# Patient Record
Sex: Female | Born: 1985 | Race: Black or African American | Hispanic: No | Marital: Married | Smoking: Former smoker
Health system: Southern US, Community
[De-identification: ages and names within clinical notes are randomized; demographics above are authoritative.]

## PROBLEM LIST (undated history)

## (undated) DIAGNOSIS — R51 Headache: Secondary | ICD-10-CM

## (undated) DIAGNOSIS — R519 Headache, unspecified: Secondary | ICD-10-CM

## (undated) DIAGNOSIS — Z8759 Personal history of other complications of pregnancy, childbirth and the puerperium: Secondary | ICD-10-CM

## (undated) HISTORY — DX: Personal history of other complications of pregnancy, childbirth and the puerperium: Z87.59

## (undated) HISTORY — DX: Headache, unspecified: R51.9

## (undated) HISTORY — DX: Headache: R51

---

## 2004-04-30 ENCOUNTER — Ambulatory Visit: Payer: Self-pay | Admitting: Obstetrics and Gynecology

## 2004-06-06 ENCOUNTER — Ambulatory Visit: Payer: Self-pay | Admitting: Obstetrics and Gynecology

## 2004-06-20 ENCOUNTER — Ambulatory Visit: Payer: Self-pay | Admitting: Obstetrics and Gynecology

## 2004-09-29 ENCOUNTER — Emergency Department (HOSPITAL_COMMUNITY): Admission: EM | Admit: 2004-09-29 | Discharge: 2004-09-29 | Payer: Self-pay | Admitting: Emergency Medicine

## 2005-09-12 ENCOUNTER — Emergency Department (HOSPITAL_COMMUNITY): Admission: EM | Admit: 2005-09-12 | Discharge: 2005-09-12 | Payer: Self-pay | Admitting: Emergency Medicine

## 2006-12-28 ENCOUNTER — Encounter: Admission: RE | Admit: 2006-12-28 | Discharge: 2006-12-28 | Payer: Self-pay | Admitting: Obstetrics & Gynecology

## 2007-05-18 ENCOUNTER — Emergency Department (HOSPITAL_COMMUNITY): Admission: EM | Admit: 2007-05-18 | Discharge: 2007-05-18 | Payer: Self-pay | Admitting: Emergency Medicine
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
## 2010-05-31 NOTE — Group Therapy Note (Signed)
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  She was brought in today to discuss treatment.  She is a  smoker.  I have counseled her on how important it was to stop smoking and  that if she needed help we could help her with that.   PHYSICAL EXAMINATION:  GENITOURINARY:  On examination, she has a small  cervix, very slight ectropion which I think is amenable to a LEEP treatment  here in the clinic.   MEDICAL DECISION MAKING:  She will be watching the film today, and we will  schedule her for a LEEP conization in the near future.   IMPRESSION:  Cervical intraepithelial neoplasia, grade 2-3.      PR/MEDQ  D:  04/30/2004  T:  04/30/2004  Job:  [id number]

## 2010-05-31 NOTE — Group Therapy Note (Signed)
NAMELONITA, DEBES NO.:  [id number]   MEDICAL RECORD NO.:  [id number]          PATIENT TYPE:  WOC   LOCATION:  [facility]                   FACILITY:  [facility]   PHYSICIAN:  Argentina Donovan, MD        DATE OF BIRTH:  11-30-1985   DATE OF SERVICE:  06/06/2004                                    CLINIC NOTE   PROCEDURE:  LEEP conization of cervix.   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   POSTOPERATIVE DIAGNOSIS:  Pending pathology report.   ANESTHESIA:  Local.   SURGEON:  Argentina Donovan, M.D.   SPECIMEN SENT TO PATHOLOGY:  LEEP conization.   DESCRIPTION OF PROCEDURE:  With the patient in the dorsal lithotomy  position, Graves insulated speculum placed in the vagina so the cervix was  in the center of the viewing area, and a lateral speculum placed alongside.  Using 1 mL of 1% Xylocaine with 1:100,000 epinephrine injected in each of  four quadrants at 2, 4, 8, and 10 o'clock around the cervical os and  transition zone, a 1.5 LEEP loop was used to take the biopsy specimen. The  surrounding areas were then coagulated with a 50 watt coagulating current.  The original biopsy was taken with a 50 blended one. There was minimal to no  bleeding. The patient tolerated the procedure well and was discharged to  home.   PLAN:  Follow-up Pap smear in 4 months.      PR/MEDQ  D:  06/06/2004  T:  06/06/2004  Job:  [id number]

## 2014-03-13 ENCOUNTER — Emergency Department (HOSPITAL_COMMUNITY)
Admission: EM | Admit: 2014-03-13 | Discharge: 2014-03-13 | Disposition: A | Discharge status: Home / Self Care | Payer: Self-pay | Attending: Emergency Medicine | Admitting: Emergency Medicine

## 2014-03-13 ENCOUNTER — Emergency Department (HOSPITAL_COMMUNITY): Payer: Self-pay

## 2014-03-13 ENCOUNTER — Encounter (HOSPITAL_COMMUNITY): Payer: Self-pay | Admitting: *Deleted

## 2014-03-13 DIAGNOSIS — S46911A Strain of unspecified muscle, fascia and tendon at shoulder and upper arm level, right arm, initial encounter: Secondary | ICD-10-CM | POA: Insufficient documentation

## 2014-03-13 DIAGNOSIS — Y998 Other external cause status: Secondary | ICD-10-CM | POA: Insufficient documentation

## 2014-03-13 DIAGNOSIS — Y9389 Activity, other specified: Secondary | ICD-10-CM | POA: Insufficient documentation

## 2014-03-13 DIAGNOSIS — S46811A Strain of other muscles, fascia and tendons at shoulder and upper arm level, right arm, initial encounter: Secondary | ICD-10-CM

## 2014-03-13 DIAGNOSIS — G5601 Carpal tunnel syndrome, right upper limb: Secondary | ICD-10-CM

## 2014-03-13 DIAGNOSIS — X58XXXA Exposure to other specified factors, initial encounter: Secondary | ICD-10-CM | POA: Insufficient documentation

## 2014-03-13 DIAGNOSIS — Y9289 Other specified places as the place of occurrence of the external cause: Secondary | ICD-10-CM | POA: Insufficient documentation

## 2014-03-13 MED ORDER — HYDROCODONE-ACETAMINOPHEN 5-325 MG PO TABS
1.0000 | ORAL_TABLET | ORAL | Status: DC | PRN
Start: 2014-03-13 — End: 2014-07-11

## 2014-03-13 MED ORDER — DICLOFENAC SODIUM 75 MG PO TBEC: 75.0000 mg | DELAYED_RELEASE_TABLET | Freq: Two times a day (BID) | ORAL | Status: DC

## 2014-03-13 MED ORDER — KETOROLAC TROMETHAMINE 10 MG PO TABS
10.0000 mg | ORAL_TABLET | Freq: Once | ORAL | Status: AC
  Administered 2014-03-13: 10 mg via ORAL
  Filled 2014-03-13: qty 1

## 2014-03-13 NOTE — Discharge Instructions (Signed)
Please use your sling and splint daily. See Dr Romeo AppleHarrison or the orthopedic MD of your choice if symptoms not improving. Carpal Tunnel Syndrome The carpal tunnel is an area under the skin of the palm of your hand. Nerves, blood vessels, and strong tissues (tendons) pass through the tunnel. The tunnel can become puffy (swollen). If this happens, a nerve can be pinched in the wrist. This causes carpal tunnel syndrome.  HOME CARE  Take all medicine as told by your doctor.  If you were given a splint, wear it as told. Wear it at night or at times when your doctor told you to.  Rest your wrist from the activity that causes your pain.  Put ice on your wrist after long periods of wrist activity.  Put ice in a plastic bag.  Place a towel between your skin and the bag.  Leave the ice on for 15-20 minutes, 03-04 times a day.  Keep all doctor visits as told. GET HELP RIGHT AWAY IF:  You have new problems you cannot explain.  Your problems get worse and medicine does not help. MAKE SURE YOU:   Understand these instructions.  Will watch your condition.  Will get help right away if you are not doing well or get worse. Document Released: 12/19/2010 Document Revised: 03/24/2011 Document Reviewed: 12/19/2010 [facility] Patient Information 2015 [employer], [place]. This information is not intended to replace advice given to you by your health care provider. Make sure you discuss any questions you have with your health care provider.  Shoulder Sprain A shoulder sprain is the result of damage to the tough, fiber-like tissues (ligaments) that help hold your shoulder in place. The ligaments may be stretched or torn. Besides the main shoulder joint (the ball and socket), there are several smaller joints that connect the bones in this area. A sprain usually involves one of those joints. Most often it is the acromioclavicular (or AC) joint. That is the joint that connects the collarbone (clavicle) and the  shoulder blade (scapula) at the top point of the shoulder blade (acromion). A shoulder sprain is a mild form of what is called a shoulder separation. Recovering from a shoulder sprain may take some time. For some, pain lingers for several months. Most people recover without long term problems. CAUSES   A shoulder sprain is usually caused by some kind of trauma. This might be:  Falling on an outstretched arm.  Being hit hard on the shoulder.  Twisting the arm.  Shoulder sprains are more likely to occur in people who:  Play sports.  Have balance or coordination problems. SYMPTOMS   Pain when you move your shoulder.  Limited ability to move the shoulder.  Swelling and tenderness on top of the shoulder.  Redness or warmth in the shoulder.  Bruising.  A change in the shape of the shoulder. DIAGNOSIS  Your healthcare provider may:  Ask about your symptoms.  Ask about recent activity that might have caused those symptoms.  Examine your shoulder. You may be asked to do simple exercises to test movement. The other shoulder will be examined for comparison.  Order some tests that provide a look inside the body. They can show the extent of the injury. The tests could include:  X-rays.  CT (computed tomography) scan.  MRI (magnetic resonance imaging) scan. RISKS AND COMPLICATIONS  Loss of full shoulder motion.  Ongoing shoulder pain. TREATMENT  How long it takes to recover from a shoulder sprain depends on how severe it  was. Treatment options may include:  Rest. You should not use the arm or shoulder until it heals.  Ice. For 2 or 3 days after the injury, put an ice pack on the shoulder up to 4 times a day. It should stay on for 15 to 20 minutes each time. Wrap the ice in a towel so it does not touch your skin.  Over-the-counter medicine to relieve pain.  A sling or brace. This will keep the arm still while the shoulder is healing.  Physical therapy or rehabilitation  exercises. These will help you regain strength and motion. Ask your healthcare provider when it is OK to begin these exercises.  Surgery. The need for surgery is rare with a sprained shoulder, but some people may need surgery to keep the joint in place and reduce pain. HOME CARE INSTRUCTIONS   Ask your healthcare provider about what you should and should not do while your shoulder heals.  Make sure you know how to apply ice to the correct area of your shoulder.  Talk with your healthcare provider about which medications should be used for pain and swelling.  If rehabilitation therapy will be needed, ask your healthcare provider to refer you to a therapist. If it is not recommended, then ask about at-home exercises. Find out when exercise should begin. SEEK MEDICAL CARE IF:  Your pain, swelling, or redness at the joint increases. SEEK IMMEDIATE MEDICAL CARE IF:   You have a fever.  You cannot move your arm or shoulder. Document Released: 05/18/2008 Document Revised: 03/24/2011 Document Reviewed: 05/18/2008 [facility] Patient Information 2015 [place], [place]. This information is not intended to replace advice given to you by your health care provider. Make sure you discuss any questions you have with your health care provider.

## 2014-03-13 NOTE — ED Notes (Signed)
Pain rt arm /shoulder, with numb sensation rt forearm at times.  Certain movements of neck make it worse.

## 2014-03-13 NOTE — ED Provider Notes (Signed)
CSN: [id number]     Arrival date & time 03/13/14  1100 History  This chart was scribed for non-physician practitioner Ivery Quale, PA-C, working with Benny Lennert, MD by Littie Deeds, ED Scribe. This patient was seen in room APFT20/APFT20 and the patient's care was started at 1:45 PM.      Chief Complaint  Patient presents with  . Arm Pain   The history is provided by the patient. No language interpreter was used.   HPI Comments: Veronica Whitaker is a 29 y.o. female who presents to the Emergency Department complaining of gradual onset, worsening right shoulder pain radiating down her arm that started about 2 weeks ago. Patient initially thought it was a spasm, but it has been getting worse every day. Her pain is worsened with certain movements. She also reports having associated neck pain worsened with certain movements as well as right arm numbness that started today. Patient denies injury.  Patient also reports having intermittent, sharp, burning right wrist pain with swelling that radiates up to her arm. This is also worsened with certain movements.  History reviewed. No pertinent past medical history. Past Surgical History  Procedure Laterality Date  . Cesarean section     History reviewed. No pertinent family history. History  Substance Use Topics  . Smoking status: Never Smoker   . Smokeless tobacco: Not on file  . Alcohol Use: Yes   OB History    No data available     Review of Systems  Musculoskeletal: Positive for arthralgias and neck pain.  Neurological: Positive for numbness.  All other systems reviewed and are negative.     Allergies  Review of patient's allergies indicates no known allergies.  Home Medications   Prior to Admission medications   Not on File   BP 102/56 mmHg  Pulse 81  Temp(Src) 98.3 F (36.8 C) (Oral)  Resp 18  Ht  (1.499 m)  Wt 125 lb (56.7 kg)  BMI 25.23 kg/m2  SpO2 100%  LMP 01/29/2014 Physical Exam  Constitutional: She is  oriented to person, place, and time. She appears well-developed and well-nourished. No distress.  HENT:  Head: Normocephalic and atraumatic.  Mouth/Throat: Oropharynx is clear and moist. No oropharyngeal exudate.  Eyes: Pupils are equal, round, and reactive to light.  Neck: Neck supple.  Cardiovascular: Normal rate, regular rhythm and normal heart sounds.   No murmur heard. Pulmonary/Chest: Effort normal and breath sounds normal. No respiratory distress. She has no wheezes. She has no rales.  Musculoskeletal: She exhibits tenderness. She exhibits no edema.  Pain at upper trapezius on the right. Pain at the [facility] joint on the right. Pain along scapula on the right. No deformity of the clavicle. Pain on the right with shoulder shrug. Positive Tinel's and pain with wrist flexion.  Neurological: She is alert and oriented to person, place, and time. No cranial nerve deficit.  Skin: Skin is warm and dry. No rash noted.  Psychiatric: She has a normal mood and affect. Her behavior is normal.  Nursing note and vitals reviewed.   ED Course  Procedures  DIAGNOSTIC STUDIES: Oxygen Saturation is 100% on room air, normal by my interpretation.    COORDINATION OF CARE: 1:54 PM-Discussed treatment plan which includes medication, sling and wrist splint with pt at bedside and pt agreed to plan.    Labs Review Labs Reviewed - No data to display  Imaging Review Dg Shoulder Right  03/13/2014   CLINICAL DATA:  Acute right shoulder pain  for 2 weeks after stretching injury. Initial encounter.  EXAM: RIGHT SHOULDER - 2+ VIEW  COMPARISON:  None.  FINDINGS: There is no evidence of fracture or dislocation. There is no evidence of arthropathy or other focal bone abnormality. Soft tissues are unremarkable.  IMPRESSION: Normal right shoulder.   Electronically Signed   By: Lupita RaiderJames  Green Jr, M.D.   On: 03/13/2014 12:41     EKG Interpretation None      MDM  Examination favors trapezius strain, and carpal tunnel  syndrome on the right. Pt would not cooperate for rotator cuff testing.  Pt placed in wrist splint and sling. Rx for diclofenac and norco given to the patient. Pt referred to orthopedics.   Final diagnoses:  Carpal tunnel syndrome of right wrist  Trapezius strain, right, initial encounter    *I have reviewed nursing notes, vital signs, and all appropriate lab and imaging results for this patient.**  **I personally performed the services described in this documentation, which was scribed in my presence. The recorded information has been reviewed and is accurate.Kathie Dike*   Jaquil Todt M Elizar Alpern, PA-C 03/14/14 2041  Benny LennertJoseph L Zammit, MD 03/16/14 [phone]

## 2014-07-11 ENCOUNTER — Encounter: Payer: Self-pay | Admitting: *Deleted

## 2014-07-13 ENCOUNTER — Telehealth: Payer: Self-pay | Admitting: *Deleted

## 2014-07-13 ENCOUNTER — Other Ambulatory Visit: Payer: Self-pay | Admitting: Obstetrics & Gynecology

## 2014-07-13 DIAGNOSIS — O3680X1 Pregnancy with inconclusive fetal viability, fetus 1: Secondary | ICD-10-CM

## 2014-07-13 NOTE — Telephone Encounter (Signed)
Pt c/o brownish spotting, no cramping, no vaginal bleeding with early pregnancy. Pt informed to keep her appt for tomorrow for her initial u/s. If cramping or vaginal bleeding occures pt to go to [facility]. Pt verbalized understanding.

## 2014-07-14 ENCOUNTER — Other Ambulatory Visit: Payer: Medicaid Other

## 2014-07-14 ENCOUNTER — Other Ambulatory Visit: Payer: Self-pay | Admitting: Obstetrics & Gynecology

## 2014-07-14 ENCOUNTER — Ambulatory Visit (INDEPENDENT_AMBULATORY_CARE_PROVIDER_SITE_OTHER): Payer: Medicaid Other

## 2014-07-14 DIAGNOSIS — O4691 Antepartum hemorrhage, unspecified, first trimester: Secondary | ICD-10-CM | POA: Diagnosis not present

## 2014-07-14 DIAGNOSIS — O3680X1 Pregnancy with inconclusive fetal viability, fetus 1: Secondary | ICD-10-CM

## 2014-07-14 DIAGNOSIS — O209 Hemorrhage in early pregnancy, unspecified: Secondary | ICD-10-CM

## 2014-07-14 DIAGNOSIS — O200 Threatened abortion: Secondary | ICD-10-CM

## 2014-07-14 NOTE — Progress Notes (Addendum)
US normal anteverted uterus w/ NO IUP seen, unknown LMP,normal ov's bilat, EEC 1CM,Pt will have blood work done today and on Tuesday per Dr. Emelda FearFerguson.

## 2014-07-15 LAB — BETA HCG QUANT (REF LAB): hCG Quant: 1355 m[IU]/mL

## 2014-07-19 ENCOUNTER — Telehealth: Payer: Self-pay | Admitting: Obstetrics & Gynecology

## 2014-07-19 DIAGNOSIS — O200 Threatened abortion: Secondary | ICD-10-CM

## 2014-07-19 LAB — BETA HCG QUANT (REF LAB): hCG Quant: 112 m[IU]/mL

## 2014-07-19 NOTE — Telephone Encounter (Signed)
Pt informed QHCG 113, did decrease from last QHCG.

## 2014-07-19 NOTE — Telephone Encounter (Signed)
Pt was bleeding at time of u/s here, and qhcg was 1355, and has declined to 112, pt assymptomatic. This is consistent with a spontaneous early pregnancy loss. Please confirm blood type in chart, if rh negative, needs Rhophylac Repeat urine preg test in 1 wk, should be negative

## 2014-07-20 NOTE — Telephone Encounter (Signed)
Pt informed per Dr. Emelda FearFerguson, [facility], also ABO/Rh ordered for verify pt blood type. If negative will need Rhogam. Pt aware ordered placed, to go to [facility] tomorrow for ABO/Rh. Pt to call our office back to find out results.

## 2014-07-21 ENCOUNTER — Other Ambulatory Visit: Payer: Self-pay | Admitting: Obstetrics and Gynecology

## 2014-07-21 LAB — OB RESULTS CONSOLE ABO/RH: RH TYPE: POSITIVE

## 2014-07-22 LAB — ABO/RH: RH TYPE: POSITIVE

## 2014-07-22 LAB — HCG, BETA SUBUNIT, QN (SERIAL): HCG, Beta Chain, Quant, S: 31 m[IU]/mL

## 2014-07-24 ENCOUNTER — Telehealth: Payer: Self-pay | Admitting: *Deleted

## 2014-07-24 NOTE — Telephone Encounter (Signed)
Spoke with the pt and she is aware of her blood type and QHCG results. Pt wanted to know what to do next. I switched the call up front so the pt could make an appointment to discuss things with a provider. Pt verbalized understanding, appointment was given.

## 2014-07-26 ENCOUNTER — Ambulatory Visit (INDEPENDENT_AMBULATORY_CARE_PROVIDER_SITE_OTHER): Payer: Medicaid Other | Admitting: Women's Health

## 2014-07-26 ENCOUNTER — Encounter: Payer: Self-pay | Admitting: Women's Health

## 2014-07-26 VITALS — BP 90/60 | HR 64 | Ht 59.0 in | Wt 133.0 lb

## 2014-07-26 DIAGNOSIS — Z3009 Encounter for other general counseling and advice on contraception: Secondary | ICD-10-CM

## 2014-07-26 DIAGNOSIS — O039 Complete or unspecified spontaneous abortion without complication: Secondary | ICD-10-CM

## 2014-07-26 MED ORDER — NORETHIN-ETH ESTRAD-FE BIPHAS 1 MG-10 MCG / 10 MCG PO TABS: 1.0000 | ORAL_TABLET | Freq: Every day | ORAL | Status: DC

## 2014-07-26 NOTE — Patient Instructions (Signed)
Miscarriage A miscarriage is the sudden loss of an unborn baby (fetus) before the 20th week of pregnancy. Most miscarriages happen in the first 3 months of pregnancy. Sometimes, it happens before a woman even knows she is pregnant. A miscarriage is also called a "spontaneous miscarriage" or "early pregnancy loss." Having a miscarriage can be an emotional experience. Talk with your caregiver about any questions you may have about miscarrying, the grieving process, and your future pregnancy plans. CAUSES   Problems with the fetal chromosomes that make it impossible for the baby to develop normally. Problems with the baby's genes or chromosomes are most often the result of errors that occur, by chance, as the embryo divides and grows. The problems are not inherited from the parents.  Infection of the cervix or uterus.   Hormone problems.   Problems with the cervix, such as having an incompetent cervix. This is when the tissue in the cervix is not strong enough to hold the pregnancy.   Problems with the uterus, such as an abnormally shaped uterus, uterine fibroids, or congenital abnormalities.   Certain medical conditions.   Smoking, drinking alcohol, or taking illegal drugs.   Trauma.  Often, the cause of a miscarriage is unknown.  SYMPTOMS   Vaginal bleeding or spotting, with or without cramps or pain.  Pain or cramping in the abdomen or lower back.  Passing fluid, tissue, or blood clots from the vagina. DIAGNOSIS  Your caregiver will perform a physical exam. You may also have an ultrasound to confirm the miscarriage. Blood or urine tests may also be ordered. TREATMENT   Sometimes, treatment is not necessary if you naturally pass all the fetal tissue that was in the uterus. If some of the fetus or placenta remains in the body (incomplete miscarriage), tissue left behind may become infected and must be removed. Usually, a dilation and curettage (D and C) procedure is performed.  During a D and C procedure, the cervix is widened (dilated) and any remaining fetal or placental tissue is gently removed from the uterus.  Antibiotic medicines are prescribed if there is an infection. Other medicines may be given to reduce the size of the uterus (contract) if there is a lot of bleeding.  If you have Rh negative blood and your baby was Rh positive, you will need a Rh immunoglobulin shot. This shot will protect any future baby from having Rh blood problems in future pregnancies. HOME CARE INSTRUCTIONS   Your caregiver may order bed rest or may allow you to continue light activity. Resume activity as directed by your caregiver.  Have someone help with home and family responsibilities during this time.   Keep track of the number of sanitary pads you use each day and how soaked (saturated) they are. Write down this information.   Do not use tampons. Do not douche or have sexual intercourse until approved by your caregiver.   Only take over-the-counter or prescription medicines for pain or discomfort as directed by your caregiver.   Do not take aspirin. Aspirin can cause bleeding.   Keep all follow-up appointments with your caregiver.   If you or your partner have problems with grieving, talk to your caregiver or seek counseling to help cope with the pregnancy loss. Allow enough time to grieve before trying to get pregnant again.  SEEK IMMEDIATE MEDICAL CARE IF:   You have severe cramps or pain in your back or abdomen.  You have a fever.  You pass large blood clots (walnut-sized   or larger) ortissue from your vagina. Save any tissue for your caregiver to inspect.   Your bleeding increases.   You have a thick, bad-smelling vaginal discharge.  You become lightheaded, weak, or you faint.   You have chills.  MAKE SURE YOU:  Understand these instructions.  Will watch your condition.  Will get help right away if you are not doing well or get  worse. Document Released: 06/25/2000 Document Revised: 04/26/2012 Document Reviewed: 02/18/2011 [facility] Patient Information 2015 [place], [place]. This information is not intended to replace advice given to you by your health care provider. Make sure you discuss any questions you have with your health care provider.  Oral Contraception Use Oral contraceptive pills (OCPs) are medicines taken to prevent pregnancy. OCPs work by preventing the ovaries from releasing eggs. The hormones in OCPs also cause the cervical mucus to thicken, preventing the sperm from entering the uterus. The hormones also cause the uterine lining to become thin, not allowing a fertilized egg to attach to the inside of the uterus. OCPs are highly effective when taken exactly as prescribed. However, OCPs do not prevent sexually transmitted diseases (STDs). Safe sex practices, such as using condoms along with an OCP, can help prevent STDs. Before taking OCPs, you may have a physical exam and Pap test. Your health care provider may also order blood tests if necessary. Your health care provider will make sure you are a good candidate for oral contraception. Discuss with your health care provider the possible side effects of the OCP you may be prescribed. When starting an OCP, it can take 2 to 3 months for the body to adjust to the changes in hormone levels in your body.  HOW TO TAKE ORAL CONTRACEPTIVE PILLS Your health care provider may advise you on how to start taking the first cycle of OCPs. Otherwise, you can:   Start on day 1 of your menstrual period. You will not need any backup contraceptive protection with this start time.   Start on the first Sunday after your menstrual period or the day you get your prescription. In these cases, you will need to use backup contraceptive protection for the first week.   Start the pill at any time of your cycle. If you take the pill within 5 days of the start of your period, you are protected  against pregnancy right away. In this case, you will not need a backup form of birth control. If you start at any other time of your menstrual cycle, you will need to use another form of birth control for 7 days. If your OCP is the type called a minipill, it will protect you from pregnancy after taking it for 2 days (48 hours). After you have started taking OCPs:   If you forget to take 1 pill, take it as soon as you remember. Take the next pill at the regular time.   If you miss 2 or more pills, call your health care provider because different pills have different instructions for missed doses. Use backup birth control until your next menstrual period starts.   If you use a 28-day pack that contains inactive pills and you miss 1 of the last 7 pills (pills with no hormones), it will not matter. Throw away the rest of the non-hormone pills and start a new pill pack.  No matter which day you start the OCP, you will always start a new pack on that same day of the week. Have an extra pack of  OCPs and a backup contraceptive method available in case you miss some pills or lose your OCP pack.  HOME CARE INSTRUCTIONS   Do not smoke.   Always use a condom to protect against STDs. OCPs do not protect against STDs.   Use a calendar to mark your menstrual period days.   Read the information and directions that came with your OCP. Talk to your health care provider if you have questions.  SEEK MEDICAL CARE IF:   You develop nausea and vomiting.   You have abnormal vaginal discharge or bleeding.   You develop a rash.   You miss your menstrual period.   You are losing your hair.   You need treatment for mood swings or depression.   You get dizzy when taking the OCP.   You develop acne from taking the OCP.   You become pregnant.  SEEK IMMEDIATE MEDICAL CARE IF:   You develop chest pain.   You develop shortness of breath.   You have an uncontrolled or severe headache.    You develop numbness or slurred speech.   You develop visual problems.   You develop pain, redness, and swelling in the legs.  Document Released: 12/19/2010 Document Revised: 05/16/2013 Document Reviewed: 06/20/2012 [facility] Patient Information 2015 [place], [place]. This information is not intended to replace advice given to you by your health care provider. Make sure you discuss any questions you have with your health care provider.  Ethinyl Estradiol; Norethindrone Acetate; Ferrous fumarate tablets (contraception) What is this medicine? ETHINYL ESTRADIOL; NORETHINDRONE ACETATE; FERROUS FUMARATE (ETH in il es tra DYE ole; nor eth IN drone AS e tate; FER [place] FUE ma rate) is an oral contraceptive. The products combine two types of female hormones, an estrogen and a progestin. They are used to prevent ovulation and pregnancy. Some products are also used to treat acne in females. This medicine may be used for other purposes; ask your health care provider or pharmacist if you have questions. COMMON BRAND NAME(S): Estrostep Fe, Gildess Fe 1.5/30, Gildess Fe 1/20, Junel Fe 1.5/30, Junel Fe 1/20, Larin Fe, Lo Loestrin Fe, Loestrin 24 Fe, Loestrin FE 1.5/30, Loestrin FE 1/20, Lomedia 24 Fe, Microgestin Fe 1.5/30, Microgestin Fe 1/20, Tarina Fe 1/20, Tilia Fe, Tri-Legest Fe What should I tell my health care provider before I take this medicine? They need to know if you have any of these conditions: -abnormal vaginal bleeding -blood vessel disease -breast, cervical, endometrial, ovarian, liver, or uterine cancer -diabetes -gallbladder disease -heart disease or recent heart attack -high blood pressure -high cholesterol -history of blood clots -kidney disease -liver disease -migraine headaches -smoke tobacco -stroke -systemic lupus erythematosus (SLE) -an unusual or allergic reaction to estrogens, progestins, other medicines, foods, dyes, or preservatives -pregnant or trying to get  pregnant -breast-feeding How should I use this medicine? Take this medicine by mouth. To reduce nausea, this medicine may be taken with food. Follow the directions on the prescription label. Take this medicine at the same time each day and in the order directed on the package. Do not take your medicine more often than directed. A patient package insert for the product will be given with each prescription and refill. Read this sheet carefully each time. The sheet may change frequently. Contact your pediatrician regarding the use of this medicine in children. Special care may be needed. This medicine has been used in female children who have started having menstrual periods. Overdosage: If you think you've taken too much of this medicine  contact a poison control center or emergency room at once. Overdosage: If you think you have taken too much of this medicine contact a poison control center or emergency room at once. NOTE: This medicine is only for you. Do not share this medicine with others. What if I miss a dose? If you miss a dose, refer to the patient information sheet you received with your medicine for direction. If you miss more than one pill, this medicine may not be as effective and you may need to use another form of birth control. What may interact with this medicine? -acetaminophen -antibiotics or medicines for infections, especially rifampin, rifabutin, rifapentine, and griseofulvin, and possibly penicillins or tetracyclines -aprepitant -ascorbic acid (vitamin C) -atorvastatin -barbiturate medicines, such as phenobarbital -bosentan -carbamazepine -caffeine -clofibrate -cyclosporine -dantrolene -doxercalciferol -felbamate -grapefruit juice -hydrocortisone -medicines for anxiety or sleeping problems, such as diazepam or temazepam -medicines for diabetes, including pioglitazone -mineral  oil -modafinil -mycophenolate -nefazodone -oxcarbazepine -phenytoin -prednisolone -ritonavir or other medicines for HIV infection or AIDS -rosuvastatin -selegiline -soy isoflavones supplements -St. John's wort -tamoxifen or raloxifene -theophylline -thyroid hormones -topiramate -warfarin This list may not describe all possible interactions. Give your health care provider a list of all the medicines, herbs, non-prescription drugs, or dietary supplements you use. Also tell them if you smoke, drink alcohol, or use illegal drugs. Some items may interact with your medicine. What should I watch for while using this medicine? Visit your doctor or health care professional for regular checks on your progress. You will need a regular breast and pelvic exam and Pap smear while on this medicine. Use an additional method of contraception during the first cycle that you take these tablets. If you have any reason to think you are pregnant, stop taking this medicine right away and contact your doctor or health care professional. If you are taking this medicine for hormone related problems, it may take several cycles of use to see improvement in your condition. Smoking increases the risk of getting a blood clot or having a stroke while you are taking birth control pills, especially if you are more than 31 years old. You are strongly advised not to smoke. This medicine can make your body retain fluid, making your fingers, hands, or ankles swell. Your blood pressure can go up. Contact your doctor or health care professional if you feel you are retaining fluid. This medicine can make you more sensitive to the sun. Keep out of the sun. If you cannot avoid being in the sun, wear protective clothing and use sunscreen. Do not use sun lamps or tanning beds/booths. If you wear contact lenses and notice visual changes, or if the lenses begin to feel uncomfortable, consult your eye care specialist. In some women,  tenderness, swelling, or minor bleeding of the gums may occur. Notify your dentist if this happens. Brushing and flossing your teeth regularly may help limit this. See your dentist regularly and inform your dentist of the medicines you are taking. If you are going to have elective surgery, you may need to stop taking this medicine before the surgery. Consult your health care professional for advice. This medicine does not protect you against HIV infection (AIDS) or any other sexually transmitted diseases. What side effects may I notice from receiving this medicine? Side effects that you should report to your doctor or health care professional as soon as possible: -allergic reactions like skin rash, itching or hives, swelling of the face, lips, or tongue -breast tissue changes or discharge -changes  in vaginal bleeding during your period or between your periods -changes in vision -chest pain -confusion -coughing up blood -dizziness -feeling faint or lightheaded -headaches or migraines -leg, arm or groin pain -loss of balance or coordination -severe or sudden headaches -stomach pain (severe) -sudden shortness of breath -sudden numbness or weakness of the face, arm or leg -symptoms of vaginal infection like itching, irritation or unusual discharge -tenderness in the upper abdomen -trouble speaking or understanding -vomiting -yellowing of the eyes or skin Side effects that usually do not require medical attention (Report these to your doctor or health care professional if they continue or are bothersome.): -breakthrough bleeding and spotting that continues beyond the 3 initial cycles of pills -breast tenderness -mood changes, anxiety, depression, frustration, anger, or emotional outbursts -increased sensitivity to sun or ultraviolet light -nausea -skin rash, acne, or brown spots on the skin -weight gain (slight) This list may not describe all possible side effects. Call your doctor for  medical advice about side effects. You may report side effects to FDA at 1-800-FDA-1088. Where should I keep my medicine? Keep out of the reach of children. Store at room temperature between 15 and 30 degrees C (59 and 86 degrees F). Throw away any unused medicine after the expiration date. NOTE: This sheet is a summary. It may not cover all possible information. If you have questions about this medicine, talk to your doctor, pharmacist, or health care provider.  2015, [employer]. (2012-05-05 15:05:22)

## 2014-07-26 NOTE — Progress Notes (Signed)
Patient ID: Veronica Whitaker, female   DOB: 09-Jul-1985, 29 y.o.   MRN: [id number]   [facility]  Patient name: Veronica Whitaker MRN [id number]  Date of birth: 09-Jul-1985  CC & HPI:  Veronica Whitaker is a 29 y.o. African American female presenting today for f/u after spontaneous SAB. Had u/s 7/1 that revealed no GS,YS, or fetal pole- she had been bleeding before u/s. Serial HCGs: 1355 (7/1), 112 (7/5), 31 (7/8). Rh +. Reports bleeding stopped last week. Has never had any pain. Wasn't trying to get pregnant but wasn't preventing. Does want to get on birth control, wants pills. Had heavy bleeding w/ pills in past, but has been a long time ago. Does not smoke, no h/o HTN, DVT/PE, CVA, MI, or migraines w/ aura. Up to date [facility]   Pertinent History Reviewed:  Medical & Surgical Hx:   History reviewed. No pertinent past medical history. Past Surgical History  Procedure Laterality Date  . Cesarean section     Medications: Reviewed & Updated - see associated section Social History: Reviewed -  reports that she has never smoked. She does not have any smokeless tobacco history on file.  Objective Findings:  Vitals: BP 90/60 mmHg  Pulse 64  Ht 4\' 11"  (1.499 m)  Wt 133 lb (60.328 kg)  BMI 26.85 kg/m2  Physical Examination: General appearance - alert, well appearing, and in no distress  No results found for this or any previous visit (from the past 24 hour(s)).   Assessment & Plan:  A:   S/P spontaneous Ab  Contraception counseling P:  Rx Lo Loestrin w/ 11RF   F/U 3mths for COC f/u   Marge DuncansBooker, Brode Sculley Randall CNM, [facility]NP-BC 07/26/2014 9:57 AM

## 2014-09-13 ENCOUNTER — Emergency Department (HOSPITAL_COMMUNITY)
Admission: EM | Admit: 2014-09-13 | Discharge: 2014-09-13 | Disposition: A | Discharge status: Home / Self Care | Payer: Medicaid Other | Attending: Emergency Medicine | Admitting: Emergency Medicine

## 2014-09-13 ENCOUNTER — Encounter (HOSPITAL_COMMUNITY): Payer: Self-pay | Admitting: *Deleted

## 2014-09-13 DIAGNOSIS — Z3490 Encounter for supervision of normal pregnancy, unspecified, unspecified trimester: Secondary | ICD-10-CM

## 2014-09-13 DIAGNOSIS — R112 Nausea with vomiting, unspecified: Secondary | ICD-10-CM | POA: Diagnosis not present

## 2014-09-13 DIAGNOSIS — Z331 Pregnant state, incidental: Secondary | ICD-10-CM | POA: Diagnosis not present

## 2014-09-13 DIAGNOSIS — Z79899 Other long term (current) drug therapy: Secondary | ICD-10-CM | POA: Insufficient documentation

## 2014-09-13 LAB — CBC WITH DIFFERENTIAL/PLATELET
Basophils Absolute: 0 10*3/uL (ref 0.0–0.1)
Basophils Relative: 0 % (ref 0–1)
EOS PCT: 0 % (ref 0–5)
Eosinophils Absolute: 0 10*3/uL (ref 0.0–0.7)
HEMATOCRIT: 36.4 % (ref 36.0–46.0)
Hemoglobin: 12.1 g/dL (ref 12.0–15.0)
LYMPHS ABS: 3.6 10*3/uL (ref 0.7–4.0)
LYMPHS PCT: 31 % (ref 12–46)
MCH: 27.8 pg (ref 26.0–34.0)
MCHC: 33.2 g/dL (ref 30.0–36.0)
MCV: 83.5 fL (ref 78.0–100.0)
MONO ABS: 1 10*3/uL (ref 0.1–1.0)
Monocytes Relative: 9 % (ref 3–12)
Neutro Abs: 7 10*3/uL (ref 1.7–7.7)
Neutrophils Relative %: 60 % (ref 43–77)
PLATELETS: 252 10*3/uL (ref 150–400)
RBC: 4.36 MIL/uL (ref 3.87–5.11)
RDW: 13.5 % (ref 11.5–15.5)
WBC: 11.7 10*3/uL — ABNORMAL HIGH (ref 4.0–10.5)

## 2014-09-13 LAB — COMPREHENSIVE METABOLIC PANEL
ALBUMIN: 4.5 g/dL (ref 3.5–5.0)
ALT: 17 U/L (ref 14–54)
AST: 16 U/L (ref 15–41)
Alkaline Phosphatase: 38 U/L (ref 38–126)
Anion gap: 7 (ref 5–15)
BUN: 8 mg/dL (ref 6–20)
CHLORIDE: 103 mmol/L (ref 101–111)
CO2: 22 mmol/L (ref 22–32)
CREATININE: 0.52 mg/dL (ref 0.44–1.00)
Calcium: 8.8 mg/dL — ABNORMAL LOW (ref 8.9–10.3)
GFR calc Af Amer: >60 mL/min (ref >60)
GFR calc non Af Amer: >60 mL/min (ref >60)
GLUCOSE: 86 mg/dL (ref 65–99)
Potassium: 3.3 mmol/L — ABNORMAL LOW (ref 3.5–5.1)
SODIUM: 132 mmol/L — AB (ref 135–145)
Total Bilirubin: 0.3 mg/dL (ref 0.3–1.2)
Total Protein: 7.8 g/dL (ref 6.5–8.1)

## 2014-09-13 LAB — LIPASE, BLOOD: Lipase: 23 U/L (ref 22–51)

## 2014-09-13 LAB — TROPONIN I: Troponin I: <0.03 ng/mL (ref <0.031)

## 2014-09-13 LAB — HCG, SERUM, QUALITATIVE: Preg, Serum: POSITIVE — AB

## 2014-09-13 MED ORDER — ONDANSETRON 8 MG PO TBDP: ORAL_TABLET | ORAL | Status: DC

## 2014-09-13 MED ORDER — SODIUM CHLORIDE 0.9 % IV BOLUS (SEPSIS)
1000.0000 mL | Freq: Once | INTRAVENOUS | Status: AC
  Administered 2014-09-13: 1000 mL via INTRAVENOUS

## 2014-09-13 MED ORDER — ONDANSETRON HCL 4 MG/2ML IJ SOLN
4.0000 mg | Freq: Once | INTRAMUSCULAR | Status: AC
  Administered 2014-09-13: 4 mg via INTRAVENOUS
  Filled 2014-09-13: qty 2

## 2014-09-13 MED ORDER — KETOROLAC TROMETHAMINE 30 MG/ML IJ SOLN
30.0000 mg | Freq: Once | INTRAMUSCULAR | Status: AC
  Administered 2014-09-13: 30 mg via INTRAVENOUS
  Filled 2014-09-13: qty 1

## 2014-09-13 NOTE — ED Notes (Signed)
Provided patient ginger ale per patient request.

## 2014-09-13 NOTE — ED Notes (Signed)
MD at bedside. 

## 2014-09-13 NOTE — Discharge Instructions (Signed)
Zofran as prescribed as needed for nausea.  Follow-up with your OB in the next week. Call to arrange this appointment.   Nausea and Vomiting Nausea is a sick feeling that often comes before throwing up (vomiting). Vomiting is a reflex where stomach contents come out of your mouth. Vomiting can cause severe loss of body fluids (dehydration). Children and elderly adults can become dehydrated quickly, especially if they also have diarrhea. Nausea and vomiting are symptoms of a condition or disease. It is important to find the cause of your symptoms. CAUSES   Direct irritation of the stomach lining. This irritation can result from increased acid production (gastroesophageal reflux disease), infection, food poisoning, taking certain medicines (such as nonsteroidal anti-inflammatory drugs), alcohol use, or tobacco use.  Signals from the brain.These signals could be caused by a headache, heat exposure, an inner ear disturbance, increased pressure in the brain from injury, infection, a tumor, or a concussion, pain, emotional stimulus, or metabolic problems.  An obstruction in the gastrointestinal tract (bowel obstruction).  Illnesses such as diabetes, hepatitis, gallbladder problems, appendicitis, kidney problems, cancer, sepsis, atypical symptoms of a heart attack, or eating disorders.  Medical treatments such as chemotherapy and radiation.  Receiving medicine that makes you sleep (general anesthetic) during surgery. DIAGNOSIS Your caregiver may ask for tests to be done if the problems do not improve after a few days. Tests may also be done if symptoms are severe or if the reason for the nausea and vomiting is not clear. Tests may include:  Urine tests.  Blood tests.  Stool tests.  Cultures (to look for evidence of infection).  X-rays or other imaging studies. Test results can help your caregiver make decisions about treatment or the need for additional tests. TREATMENT You need to stay  well hydrated. Drink frequently but in small amounts.You may wish to drink water, sports drinks, clear broth, or eat frozen ice pops or gelatin dessert to help stay hydrated.When you eat, eating slowly may help prevent nausea.There are also some antinausea medicines that may help prevent nausea. HOME CARE INSTRUCTIONS   Take all medicine as directed by your caregiver.  If you do not have an appetite, do not force yourself to eat. However, you must continue to drink fluids.  If you have an appetite, eat a normal diet unless your caregiver tells you differently.  Eat a variety of complex carbohydrates (rice, wheat, potatoes, bread), lean meats, yogurt, fruits, and vegetables.  Avoid high-fat foods because they are more difficult to digest.  Drink enough water and fluids to keep your urine clear or pale yellow.  If you are dehydrated, ask your caregiver for specific rehydration instructions. Signs of dehydration may include:  Severe thirst.  Dry lips and mouth.  Dizziness.  Dark urine.  Decreasing urine frequency and amount.  Confusion.  Rapid breathing or pulse. SEEK IMMEDIATE MEDICAL CARE IF:   You have blood or brown flecks (like coffee grounds) in your vomit.  You have black or bloody stools.  You have a severe headache or stiff neck.  You are confused.  You have severe abdominal pain.  You have chest pain or trouble breathing.  You do not urinate at least once every 8 hours.  You develop cold or clammy skin.  You continue to vomit for longer than 24 to 48 hours.  You have a fever. MAKE SURE YOU:   Understand these instructions.  Will watch your condition.  Will get help right away if you are not doing well  or get worse. Document Released: 12/30/2004 Document Revised: 03/24/2011 Document Reviewed: 05/29/2010 [facility] Patient Information 2015 [place], [place]. This information is not intended to replace advice given to you by your health care provider.  Make sure you discuss any questions you have with your health care provider.  First Trimester of Pregnancy The first trimester of pregnancy is from week 1 until the end of week 12 (months 1 through 3). A week after a sperm fertilizes an egg, the egg will implant on the wall of the uterus. This embryo will begin to develop into a baby. Genes from you and your partner are forming the baby. The female genes determine whether the baby is a boy or a girl. At 6-8 weeks, the eyes and face are formed, and the heartbeat can be seen on ultrasound. At the end of 12 weeks, all the baby's organs are formed.  Now that you are pregnant, you will want to do everything you can to have a healthy baby. Two of the most important things are to get good prenatal care and to follow your health care provider's instructions. Prenatal care is all the medical care you receive before the baby's birth. This care will help prevent, find, and treat any problems during the pregnancy and childbirth. BODY CHANGES Your body goes through many changes during pregnancy. The changes vary from woman to woman.   You may gain or lose a couple of pounds at first.  You may feel sick to your stomach (nauseous) and throw up (vomit). If the vomiting is uncontrollable, call your health care provider.  You may tire easily.  You may develop headaches that can be relieved by medicines approved by your health care provider.  You may urinate more often. Painful urination may mean you have a bladder infection.  You may develop heartburn as a result of your pregnancy.  You may develop constipation because certain hormones are causing the muscles that push waste through your intestines to slow down.  You may develop hemorrhoids or swollen, bulging veins (varicose veins).  Your breasts may begin to grow larger and become tender. Your nipples may stick out more, and the tissue that surrounds them (areola) may become darker.  Your gums may bleed and  may be sensitive to brushing and flossing.  Dark spots or blotches (chloasma, mask of pregnancy) may develop on your face. This will likely fade after the baby is born.  Your menstrual periods will stop.  You may have a loss of appetite.  You may develop cravings for certain kinds of food.  You may have changes in your emotions from day to day, such as being excited to be pregnant or being concerned that something may go wrong with the pregnancy and baby.  You may have more vivid and strange dreams.  You may have changes in your hair. These can include thickening of your hair, rapid growth, and changes in texture. Some women also have hair loss during or after pregnancy, or hair that feels dry or thin. Your hair will most likely return to normal after your baby is born. WHAT TO EXPECT AT YOUR PRENATAL VISITS During a routine prenatal visit:  You will be weighed to make sure you and the baby are growing normally.  Your blood pressure will be taken.  Your abdomen will be measured to track your baby's growth.  The fetal heartbeat will be listened to starting around week 10 or 12 of your pregnancy.  Test results from any previous visits  will be discussed. Your health care provider may ask you:  How you are feeling.  If you are feeling the baby move.  If you have had any abnormal symptoms, such as leaking fluid, bleeding, severe headaches, or abdominal cramping.  If you have any questions. Other tests that may be performed during your first trimester include:  Blood tests to find your blood type and to check for the presence of any previous infections. They will also be used to check for low iron levels (anemia) and Rh antibodies. Later in the pregnancy, blood tests for diabetes will be done along with other tests if problems develop.  Urine tests to check for infections, diabetes, or protein in the urine.  An ultrasound to confirm the proper growth and development of the  baby.  An amniocentesis to check for possible genetic problems.  Fetal screens for spina bifida and Down syndrome.  You may need other tests to make sure you and the baby are doing well. HOME CARE INSTRUCTIONS  Medicines  Follow your health care provider's instructions regarding medicine use. Specific medicines may be either safe or unsafe to take during pregnancy.  Take your prenatal vitamins as directed.  If you develop constipation, try taking a stool softener if your health care provider approves. Diet  Eat regular, well-balanced meals. Choose a variety of foods, such as meat or vegetable-based protein, fish, milk and low-fat dairy products, vegetables, fruits, and whole grain breads and cereals. Your health care provider will help you determine the amount of weight gain that is right for you.  Avoid raw meat and uncooked cheese. These carry germs that can cause birth defects in the baby.  Eating four or five small meals rather than three large meals a day may help relieve nausea and vomiting. If you start to feel nauseous, eating a few soda crackers can be helpful. Drinking liquids between meals instead of during meals also seems to help nausea and vomiting.  If you develop constipation, eat more high-fiber foods, such as fresh vegetables or fruit and whole grains. Drink enough fluids to keep your urine clear or pale yellow. Activity and Exercise  Exercise only as directed by your health care provider. Exercising will help you:  Control your weight.  Stay in shape.  Be prepared for labor and delivery.  Experiencing pain or cramping in the lower abdomen or low back is a good sign that you should stop exercising. Check with your health care provider before continuing normal exercises.  Try to avoid standing for long periods of time. Move your legs often if you must stand in one place for a long time.  Avoid heavy lifting.  Wear low-heeled shoes, and practice good  posture.  You may continue to have sex unless your health care provider directs you otherwise. Relief of Pain or Discomfort  Wear a good support bra for breast tenderness.   Take warm sitz baths to soothe any pain or discomfort caused by hemorrhoids. Use hemorrhoid cream if your health care provider approves.   Rest with your legs elevated if you have leg cramps or low back pain.  If you develop varicose veins in your legs, wear support hose. Elevate your feet for 15 minutes, 3-4 times a day. Limit salt in your diet. Prenatal Care  Schedule your prenatal visits by the twelfth week of pregnancy. They are usually scheduled monthly at first, then more often in the last 2 months before delivery.  Write down your questions. Take them to  your prenatal visits.  Keep all your prenatal visits as directed by your health care provider. Safety  Wear your seat belt at all times when driving.  Make a list of emergency phone numbers, including numbers for family, friends, the hospital, and police and fire departments. General Tips  Ask your health care provider for a referral to a local prenatal education class. Begin classes no later than at the beginning of month 6 of your pregnancy.  Ask for help if you have counseling or nutritional needs during pregnancy. Your health care provider can offer advice or refer you to specialists for help with various needs.  Do not use hot tubs, steam rooms, or saunas.  Do not douche or use tampons or scented sanitary pads.  Do not cross your legs for long periods of time.  Avoid cat litter boxes and soil used by cats. These carry germs that can cause birth defects in the baby and possibly loss of the fetus by miscarriage or stillbirth.  Avoid all smoking, herbs, alcohol, and medicines not prescribed by your health care provider. Chemicals in these affect the formation and growth of the baby.  Schedule a dentist appointment. At home, brush your teeth with  a soft toothbrush and be gentle when you floss. SEEK MEDICAL CARE IF:   You have dizziness.  You have mild pelvic cramps, pelvic pressure, or nagging pain in the abdominal area.  You have persistent nausea, vomiting, or diarrhea.  You have a bad smelling vaginal discharge.  You have pain with urination.  You notice increased swelling in your face, hands, legs, or ankles. SEEK IMMEDIATE MEDICAL CARE IF:   You have a fever.  You are leaking fluid from your vagina.  You have spotting or bleeding from your vagina.  You have severe abdominal cramping or pain.  You have rapid weight gain or loss.  You vomit blood or material that looks like coffee grounds.  You are exposed to [place] measles and have never had them.  You are exposed to fifth disease or chickenpox.  You develop a severe headache.  You have shortness of breath.  You have any kind of trauma, such as from a fall or a car accident. Document Released: 12/24/2000 Document Revised: 05/16/2013 Document Reviewed: 11/09/2012 [facility] Patient Information 2015 [place], [place]. This information is not intended to replace advice given to you by your health care provider. Make sure you discuss any questions you have with your health care provider.

## 2014-09-13 NOTE — ED Provider Notes (Signed)
CSN: [id number]     Arrival date & time 09/13/14  1416 History   First MD Initiated Contact with Patient 09/13/14 1424     Chief Complaint  Patient presents with  . Emesis     (Consider location/radiation/quality/duration/timing/severity/associated sxs/prior Treatment) HPI Comments: Patient is a 29 year old female with no significant past medical history. She presents for evaluation of vomiting for the past several days. She reports she is unable to keep anything down. She feels weak and dehydrated. She denies any fevers or chills. She denies any abdominal pain. She does report some tightness in her chest which is worse with vomiting. She denies ill contacts and having consumed any suspicious foods.  Patient is a 29 y.o. female presenting with vomiting. The history is provided by the patient.  Emesis Severity:  Moderate Duration:  3 days Timing:  Constant Progression:  Worsening Chronicity:  New Recent urination:  Normal Relieved by:  Nothing Worsened by:  Nothing tried Ineffective treatments:  None tried Associated symptoms: no abdominal pain, no diarrhea and no fever     History reviewed. No pertinent past medical history. Past Surgical History  Procedure Laterality Date  . Cesarean section     Family History  Problem Relation Age of Onset  . Hypertension Mother   . Stroke Sister    Social History  Substance Use Topics  . Smoking status: Never Smoker   . Smokeless tobacco: None  . Alcohol Use: Yes   OB History    Gravida Para Term Preterm AB TAB SAB Ectopic Multiple Living   Review of Systems  Gastrointestinal: Positive for vomiting. Negative for abdominal pain and diarrhea.  All other systems reviewed and are negative.     Allergies  Review of patient's allergies indicates no known allergies.  Home Medications   Prior to Admission medications   Medication Sig Start Date End Date Taking? Authorizing Provider  Norethindrone-Ethinyl  Estradiol-Fe Biphas (LO LOESTRIN FE) 1 MG-10 MCG / 10 MCG tablet Take 1 tablet by mouth daily. 07/26/14   Cheral Marker, CNM  Prenatal Vit-Fe Fumarate-FA (PRENATAL VITAMIN PO) Take by mouth daily.    Historical Provider, MD   BP 128/60 mmHg  Pulse 81  Temp(Src) 97.8 F (36.6 C) (Oral)  Resp 18  Ht  (1.575 m)  Wt 132 lb (59.875 kg)  BMI 24.14 kg/m2  SpO2 100%  LMP 02/12/2014 Physical Exam  Constitutional: She is oriented to person, place, and time. She appears well-developed and well-nourished. No distress.  HENT:  Head: Normocephalic and atraumatic.  Neck: Normal range of motion. Neck supple.  Cardiovascular: Normal rate and regular rhythm.  Exam reveals no gallop and no friction rub.   No murmur heard. Pulmonary/Chest: Effort normal and breath sounds normal. No respiratory distress. She has no wheezes.  Abdominal: Soft. Bowel sounds are normal. She exhibits no distension. There is no tenderness.  Musculoskeletal: Normal range of motion.  Neurological: She is alert and oriented to person, place, and time.  Skin: Skin is warm and dry. She is not diaphoretic.  Nursing note and vitals reviewed.   ED Course  Procedures (including critical care time) Labs Review Labs Reviewed  COMPREHENSIVE METABOLIC PANEL  LIPASE, BLOOD  CBC WITH DIFFERENTIAL/PLATELET  TROPONIN I  HCG, SERUM, QUALITATIVE    Imaging Review No results found. I have personally reviewed and evaluated these images and lab results as part of my medical decision-making.  EKG Interpretation   Date/Time:  Wednesday September 13 2014 14:57:29 EDT Ventricular Rate:  65 PR Interval:  136 QRS Duration: 78 QT Interval:  401 QTC Calculation: 417 R Axis:   79 Text Interpretation:  Sinus arrhythmia Low voltage, precordial leads  Baseline wander in lead(s) I II aVR Confirmed by Neosha Switalski  MD, Venesa Semidey ([id number])  on 09/13/2014 3:03:32 PM      MDM   Final diagnoses:  None    Patient presents with complaints of  vomiting for the past several days. She is experiencing no abdominal pain. Laboratory studies reveal no significant abnormality within the exception of a positive pregnancy test. Patient was unaware that she was pregnant but does report to having a miscarriage several weeks ago. As she is having no vaginal bleeding or abdominal pain I do not feel as though further workup is indicated. I will prescribe a small quantity of Zofran she can take if her symptoms become severe. She will follow-up with her OB in the next week.    Geoffery Lyons, MD 09/13/14 [phone]

## 2014-09-13 NOTE — ED Notes (Signed)
Pt states vomiting began Monday. Vomited ~4 times today. States she feels dehydrated. Pt states chest discomfort began yesterday and is worse with vomiting. Denies abdominal pain and diarrhea. Pt states states she can only keep crackers, cheese and grapes down. Unable to keep liquids down.

## 2014-09-19 ENCOUNTER — Ambulatory Visit (INDEPENDENT_AMBULATORY_CARE_PROVIDER_SITE_OTHER): Payer: Medicaid Other | Admitting: Obstetrics and Gynecology

## 2014-09-19 VITALS — BP 102/67 | HR 80 | Wt 128.1 lb

## 2014-09-19 DIAGNOSIS — Z1389 Encounter for screening for other disorder: Secondary | ICD-10-CM

## 2014-09-19 DIAGNOSIS — Z36 Encounter for antenatal screening of mother: Secondary | ICD-10-CM

## 2014-09-19 DIAGNOSIS — Z8759 Personal history of other complications of pregnancy, childbirth and the puerperium: Secondary | ICD-10-CM

## 2014-09-19 DIAGNOSIS — Z3687 Encounter for antenatal screening for uncertain dates: Secondary | ICD-10-CM

## 2014-09-19 DIAGNOSIS — Z113 Encounter for screening for infections with a predominantly sexual mode of transmission: Secondary | ICD-10-CM

## 2014-09-19 DIAGNOSIS — Z369 Encounter for antenatal screening, unspecified: Secondary | ICD-10-CM

## 2014-09-19 DIAGNOSIS — Z8742 Personal history of other diseases of the female genital tract: Secondary | ICD-10-CM

## 2014-09-19 DIAGNOSIS — Z331 Pregnant state, incidental: Secondary | ICD-10-CM

## 2014-09-19 LAB — OB RESULTS CONSOLE VARICELLA ZOSTER ANTIBODY, IGG: Varicella: NON-IMMUNE/NOT IMMUNE

## 2014-09-19 NOTE — Progress Notes (Signed)
  Nicoletta Dress for [employer] nurse interview visit. G-3. P-1011.  LMP unknown, last menses in January as far as pt can recall. Miscarriage in July 2016. No D&C. Had been on Depo-provera since she was 29yo and stopped med.in 2010, had son in 2012, then got IUD but this caused pain so it was removed in Oct 2016.  Pregnancy eduction material explained and given.  No cats in the home. NOB labs ordered HIV labs and Drug screen were explained optional and she could opt out of tests but did not decline. Drug screen ordered and sent to lab. PNV encouraged. NT to discuss with provider. Ultrasound ordered for viability and dating. Pt had miscarriage in July, with last BHCG done then was 31 at Dr. Rayna Sexton office.  Had positive bhcg with no quant at ER for excessive n/v, chest pain from n/v.   Pt. follow up with provider pending, depending on ultrasound results for NOB physical.  All questions answered.  ZIKA EXPOSURE SCREEN:  The patient has not traveled to a [place] Virus endemic area within the past 6 months, nor has she had unprotected sex with a partner who has travelled to a [place] endemic region within the past 6 months. The patient has been advised to notify us if these factors change any time during this current pregnancy, so adequate testing and monitoring can be initiated.

## 2014-09-19 NOTE — Patient Instructions (Signed)

## 2014-09-20 LAB — URINALYSIS, ROUTINE W REFLEX MICROSCOPIC
Bilirubin, UA: NEGATIVE
Glucose, UA: NEGATIVE
Leukocytes, UA: NEGATIVE
NITRITE UA: NEGATIVE
PH UA: 7.5 (ref 5.0–7.5)
RBC, UA: NEGATIVE
Specific Gravity, UA: 1.03 (ref 1.005–1.030)
Urobilinogen, Ur: 0.2 mg/dL (ref 0.2–1.0)

## 2014-09-20 LAB — GC/CHLAMYDIA PROBE AMP
CHLAMYDIA, DNA PROBE: NEGATIVE
Neisseria gonorrhoeae by PCR: NEGATIVE

## 2014-09-20 LAB — CBC WITH DIFFERENTIAL/PLATELET
BASOS: 0 %
Basophils Absolute: 0 10*3/uL (ref 0.0–0.2)
EOS (ABSOLUTE): 0 10*3/uL (ref 0.0–0.4)
Eos: 0 %
Hematocrit: 37.6 % (ref 34.0–46.6)
Hemoglobin: 12.2 g/dL (ref 11.1–15.9)
IMMATURE GRANS (ABS): 0 10*3/uL (ref 0.0–0.1)
IMMATURE GRANULOCYTES: 0 %
LYMPHS: 23 %
Lymphocytes Absolute: 2.4 10*3/uL (ref 0.7–3.1)
MCH: 26.6 pg (ref 26.6–33.0)
MCHC: 32.4 g/dL (ref 31.5–35.7)
MCV: 82 fL (ref 79–97)
Monocytes Absolute: 0.7 10*3/uL (ref 0.1–0.9)
Monocytes: 7 %
NEUTROS PCT: 70 %
Neutrophils Absolute: 7.1 10*3/uL — ABNORMAL HIGH (ref 1.4–7.0)
PLATELETS: 278 10*3/uL (ref 150–379)
RBC: 4.59 x10E6/uL (ref 3.77–5.28)
RDW: 13.5 % (ref 12.3–15.4)
WBC: 10.3 10*3/uL (ref 3.4–10.8)

## 2014-09-20 LAB — RUBELLA ANTIBODY, IGM: Rubella IgM: <20 AU/mL (ref 0.0–19.9)

## 2014-09-20 LAB — MICROSCOPIC EXAMINATION: Casts: NONE SEEN /lpf

## 2014-09-20 LAB — SICKLE CELL SCREEN: SICKLE CELL SCREEN: NEGATIVE

## 2014-09-20 LAB — HEPATITIS B SURFACE ANTIGEN: HEP B S AG: NEGATIVE

## 2014-09-20 LAB — RPR: RPR Ser Ql: NONREACTIVE

## 2014-09-20 LAB — URINE CULTURE

## 2014-09-20 LAB — VARICELLA ZOSTER ANTIBODY, IGM

## 2014-09-20 LAB — HIV ANTIBODY (ROUTINE TESTING W REFLEX): HIV SCREEN 4TH GENERATION: NONREACTIVE

## 2014-09-21 LAB — SPECIMEN STATUS REPORT

## 2014-09-21 LAB — BETA HCG QUANT (REF LAB): HCG QUANT: 127656 m[IU]/mL

## 2014-09-25 ENCOUNTER — Encounter: Payer: Self-pay | Admitting: Obstetrics and Gynecology

## 2014-09-25 ENCOUNTER — Other Ambulatory Visit: Payer: Self-pay | Admitting: Obstetrics and Gynecology

## 2014-09-25 DIAGNOSIS — F1290 Cannabis use, unspecified, uncomplicated: Secondary | ICD-10-CM

## 2014-09-26 ENCOUNTER — Ambulatory Visit: Payer: Medicaid Other

## 2014-09-26 DIAGNOSIS — Z36 Encounter for antenatal screening of mother: Secondary | ICD-10-CM | POA: Diagnosis not present

## 2014-09-26 DIAGNOSIS — Z3687 Encounter for antenatal screening for uncertain dates: Secondary | ICD-10-CM

## 2014-09-26 DIAGNOSIS — Z369 Encounter for antenatal screening, unspecified: Secondary | ICD-10-CM

## 2014-09-26 DIAGNOSIS — Z8759 Personal history of other complications of pregnancy, childbirth and the puerperium: Secondary | ICD-10-CM

## 2014-09-26 DIAGNOSIS — Z331 Pregnant state, incidental: Secondary | ICD-10-CM | POA: Diagnosis not present

## 2014-09-26 DIAGNOSIS — Z8742 Personal history of other diseases of the female genital tract: Secondary | ICD-10-CM | POA: Diagnosis not present

## 2014-10-06 ENCOUNTER — Telehealth: Payer: Self-pay | Admitting: Obstetrics and Gynecology

## 2014-10-06 NOTE — Telephone Encounter (Signed)
Pt states when she woke up this am her rt side was hurting- near her hip. Did fall asleep on couch last nite. NO other sx. Advised tylenol prn and heating pad. Pt aware if sx gets worse she will need to be seen prior to appt on 9/29.

## 2014-10-06 NOTE — Telephone Encounter (Signed)
Pt is [redacted] wks pregnant and has pain on lower right side, throbbing aching pain. Pt wants to know if this is ok?

## 2014-10-12 ENCOUNTER — Ambulatory Visit (INDEPENDENT_AMBULATORY_CARE_PROVIDER_SITE_OTHER): Payer: Medicaid Other | Admitting: Obstetrics and Gynecology

## 2014-10-12 ENCOUNTER — Encounter: Payer: Self-pay | Admitting: Obstetrics and Gynecology

## 2014-10-12 VITALS — BP 97/60 | HR 92 | Wt 130.9 lb

## 2014-10-12 DIAGNOSIS — Z3491 Encounter for supervision of normal pregnancy, unspecified, first trimester: Secondary | ICD-10-CM

## 2014-10-12 DIAGNOSIS — Z1379 Encounter for other screening for genetic and chromosomal anomalies: Secondary | ICD-10-CM | POA: Diagnosis not present

## 2014-10-12 DIAGNOSIS — O34219 Maternal care for unspecified type scar from previous cesarean delivery: Secondary | ICD-10-CM

## 2014-10-12 DIAGNOSIS — O3421 Maternal care for scar from previous cesarean delivery: Secondary | ICD-10-CM

## 2014-10-12 LAB — POCT URINALYSIS DIPSTICK
BILIRUBIN UA: NEGATIVE
Blood, UA: NEGATIVE
Glucose, UA: NEGATIVE
KETONES UA: NEGATIVE
LEUKOCYTES UA: NEGATIVE
Nitrite, UA: NEGATIVE
PROTEIN UA: NEGATIVE
Spec Grav, UA: 1.015
Urobilinogen, UA: 0.2
pH, UA: 7.5

## 2014-10-12 NOTE — Progress Notes (Signed)
Discuss nt screen. No complaints. Last pap 2/20160 wnl.

## 2014-10-12 NOTE — Patient Instructions (Signed)
Trial of Labor After Cesarean Delivery Information A trial of labor after cesarean delivery (TOLAC) is when a woman tries to give birth vaginally after a previous cesarean delivery. TOLAC may be a safe and appropriate option for you depending on your medical history and other risk factors. When TOLAC is successful and you are able to have a vaginal delivery, this is called a vaginal birth after cesarean delivery (VBAC).  CANDIDATES FOR TOLAC TOLAC is possible for some women who:  Have undergone one or two prior cesarean deliveries in which the incision of the uterus was horizontal (low transverse).  Are carrying twins and have had one prior low transverse incision during a cesarean delivery.  Do not have a vertical (classical) uterine scar.  Have not had a tear in the wall of their uterus (uterine rupture). TOLAC is also supported for women who meet appropriate criteria and:  Are under the age of 40 years.  Are tall and have a body mass index (BMI) of less than 30.  Have an unknown uterine scar.  Give birth in a facility equipped to handle an emergency cesarean delivery. This team should be able to handle possible complications such as a uterine rupture.  Have thorough counseling about the benefits and risks of TOLAC.  Have discussed future pregnancy plans with their health care provider.  Plan to have several more pregnancies. MOST SUCCESSFUL CANDIDATES FOR TOLAC:  Have had a successful vaginal delivery before or after their cesarean delivery.  Experience labor that begins naturally on or before the due date (40 weeks of gestation).  Do not have a very large (macrosomic) baby.   Had a prior cesarean delivery but are not currently experiencing factors that would prompt a cesarean delivery (such as a breech position).  Had only one prior cesarean delivery.  Had a prior cesarean delivery that was performed early in labor and not after full cervical dilation. TOLAC may be most  appropriate for women who meet the above guidelines and who plan to have more pregnancies. TOLAC is not recommended for home births. LEAST SUCCESSFUL CANDIDATES FOR TOLAC:  Have an induced labor with an unfavorable cervix. An unfavorable cervix is when the cervix is not dilating enough (among other factors).  Have never had a vaginal delivery.  Have had more than two cesarean deliveries.  Have a pregnancy at more than 40 weeks of gestation.  Are pregnant with a baby with a suspected weight greater than 4,000 grams (8 pounds) and who have no prior history of a vaginal delivery.  Have closely spaced pregnancies. SUGGESTED BENEFITS OF TOLAC  You may have a faster recovery time.  You may have a shorter stay in the hospital.  You may have less pain and fewer problems than with a cesarean delivery. Women who have a cesarean delivery have a higher chance of needing blood or getting a fever, an infection, or a blood clot in the legs. SUGGESTED RISKS OF TOLAC The highest risk of complications happens to women who attempt a TOLAC and fail. A failed TOLAC results in an unplanned cesarean delivery. Risks related to TOLAC or repeat cesarean deliveries include:   Blood loss.  Infection.  Blood clot.  Injury to surrounding tissues or organs.  Having to remove the uterus (hysterectomy).  Potential problems with the placenta (such as placenta previa or placenta accreta) in future pregnancies. Although very rare, the main concerns with TOLAC are:  Rupture of the uterine scar from a past cesarean delivery.  Needing an   emergency cesarean delivery.  Having a bad outcome for the baby (perinatal morbidity). FOR MORE INFORMATION American Congress of Obstetricians and Gynecologists: www.acog.org American College of Nurse-Midwives: www.midwife.org Document Released: 09/17/2010 Document Revised: 10/20/2012 Document Reviewed: 06/21/2012 [employer] Patient Information 2015 [employer]. This  information is not intended to replace advice given to you by your health care provider. Make sure you discuss any questions you have with your health care provider.  

## 2014-10-12 NOTE — Progress Notes (Addendum)
Subjective:    Veronica Whitaker is being seen today for her first obstetrical visit.  This is not a planned pregnancy. She is a G3P1011 at [redacted]w[redacted]d gestation, Estimated Date of Delivery: 04/29/15 by 9 week ultrasound, LMP unsure (last menses in January 2016). Her obstetrical history is significant for excessive weight gain in prior pregnancy (~ 70+ lbs), h/o recent miscarriage (July 2016), and history of C-section x 1. Relationship with FOB: significant other, not living together. Patient does intend to breast feed. Pregnancy history fully reviewed.  Menstrual History: Obstetric History   G3   P1   T1   P0   A1   TAB0   SAB1   E0   M0   L1     # Outcome Date GA Lbr Len/2nd Weight Sex Delivery Anes PTL Lv  3 Current           2 SAB 07/2014          1 Term 11/25/10 [redacted]w[redacted]d  7 lb 1 oz (3.204 kg) M CS-Unspec  N Y     Name: Greig Castilla     Complications: Fetal Intolerance      Menarche age: 35  Patient's last menstrual period was 02/12/2014 (lmp unknown).  Denies h/o history of STIs or abnormal pap smears.  Last pap smear in April 2016 (in [place], has recently relocated to [facility]).     Past Medical History  Diagnosis Date  . Hx of one miscarriage   . Frequent headaches     Past Surgical History  Procedure Laterality Date  . Cesarean section      Family History  Problem Relation Age of Onset  . Hypertension Mother   . Stroke Sister   . Prostate cancer Maternal Grandfather   . Breast cancer      father's mother  . Diabetes Maternal Grandmother     Social History   Social History  . Marital Status: Single    Spouse Name: N/A  . Number of Children: N/A  . Years of Education: N/A   Occupational History  . student    Social History Main Topics  . Smoking status: Never Smoker   . Smokeless tobacco: Not on file  . Alcohol Use: No  . Drug Use: No  . Sexual Activity: Yes    Birth Control/ Protection: Condom   Other Topics Concern  . Not on file   Social History Narrative    Current  Outpatient Prescriptions on File Prior to Visit  Medication Sig Dispense Refill  . Prenatal Vit-Fe Fumarate-FA (PRENATAL VITAMIN PO) Take by mouth daily.      No Known Allergies   Review of Systems General:Not Present- Fever, Weight Loss and Weight Gain. Skin:Not Present- Rash. HEENT:Not Present- Blurred Vision, Headache and Bleeding Gums. Respiratory:Not Present- Difficulty Breathing. Breast:Not Present- Breast Mass. Cardiovascular:Not Present- Chest Pain, Elevated Blood Pressure, Fainting / Blacking Out and Shortness of Breath. Gastrointestinal:Not Present- Abdominal Pain, Constipation, Nausea and Vomiting (resolved ~ 2 weeks ago). Female Genitourinary:Not Present- Frequency, Painful Urination, Pelvic Pain, Vaginal Bleeding, Vaginal Discharge, Contractions, regular, Fetal Movements Decreased, Urinary Complaints and Vaginal Fluid. Musculoskeletal:Not Present- Back Pain and Leg Cramps. Neurological:Not Present- Dizziness. Psychiatric:Not Present- Depression.     Objective:  Blood pressure 97/60, pulse 92, weight 130 lb 14.4 oz (59.376 kg), last menstrual period 02/12/2014.  General Appearance:    Alert, cooperative, no distress, appears stated age  Head:    Normocephalic, without obvious abnormality, atraumatic  Eyes:    PERRL, conjunctiva/corneas clear,  EOM's intact, both eyes  Ears:    Normal external ear canals, both ears  Nose:   Nares normal, septum midline, mucosa normal, no drainage or sinus tenderness  Throat:   Lips, mucosa, and tongue normal; teeth and gums normal  Neck:   Supple, symmetrical, trachea midline, no adenopathy; thyroid: no enlargement/tenderness/nodules; no carotid bruit or JVD  Back:     Symmetric, no curvature, ROM normal, no CVA tenderness  Lungs:     Clear to auscultation bilaterally, respirations unlabored  Chest Wall:    No tenderness or deformity   Heart:    Regular rate and rhythm, S1 and S2 normal, no murmur, rub or gallop  Breast Exam:     No tenderness, masses, or nipple abnormality  Abdomen:     Soft, non-tender, bowel sounds active all four quadrants, no masses, no organomegaly.  FHT 152 bpm.  Well healed Pfannenstiel incision.  Genitalia:    Pelvic:external genitalia normal, vagina with lesions, discharge, or tenderness, rectovaginal septum  normal. Cervix normal in appearance, no cervical motion tenderness, no adnexal masses or tenderness.  Pregnancy positive findings: uterine enlargement: ~ 12 wk size, nontender.   Rectal:    Normal external sphincter.  No hemorrhoids appreciated. Internal exam not done.   Extremities:   Extremities normal, atraumatic, no cyanosis or edema  Pulses:   2+ and symmetric all extremities  Skin:   Skin color, texture, turgor normal, no rashes or lesions  Lymph nodes:   Cervical, supraclavicular, and axillary nodes normal  Neurologic:   CNII-XII intact, normal strength, sensation and reflexes throughout     Assessment:   Pregnancy at 11 and 4/7 weeks   H/o prior C-section x 1   Plan:    Initial labs reviewed. Prenatal vitamins. Problem list reviewed and updated. New OB counseling: The patient has been given an overview regarding routine prenatal care. Recommendations regarding diet, weight gain, and exercise in pregnancy were given.   Prenatal testing, optional genetic testing, and ultrasound use in pregnancy were reviewed.  AFP3 discussed: ordered. Benefits of Breast Feeding were discussed. The patient is encouraged to consider nursing her baby post partum. Discussion had regarding TOLAC vs. C-section. Patient desires a TOLAC. She was induced at 40 weeks for suspected fetal macrosomia, had C-section secondary to NRFHT and fetal malposition (OP); had dilated to 8 cm. Based on history was a low-transverse incision for section and due to fetal indications; is a candidate for TOLAC. The following risks were discussed with the patient:   Risk of uterine rupture at term is 0.78 percent with TOLAC  and 0.22 percent with ERCD. 1 in 10 uterine ruptures will result in neonatal death or neurological injury. The benefits of a trial of labor after cesarean (TOLAC) resulting in a vaginal birth after cesarean (VBAC) include the following: shorter length of hospital stay and postpartum recovery (in most cases); fewer complications, such as postpartum fever, wound or uterine infection, thromboembolism (blood clots in the leg or lung), need for blood transfusion and fewer neonatal breathing problems. The risks of an attempted VBAC or TOLAC include the following: Risk of failed trial of labor after cesarean (TOLAC) without a vaginal birth after cesarean (VBAC) resulting in repeat cesarean delivery (RCD) in about 20 to 40 percent of women who attempt VBAC.  Risk of rupture of uterus resulting in an emergency cesarean delivery. The risk of uterine rupture may be related in part to the type of uterine incision made during the first cesarean delivery. A previous  transverse uterine incision has the lowest risk of rupture (0.2 to 1.5 percent risk). Vertical or T-shaped uterine incisions have a higher risk of uterine rupture (4 to 9 percent risk)The risk of fetal death is very low with both VBAC and elective repeat cesarean delivery (ERCD), but the likelihood of fetal death is higher with VBAC than with ERCD. Maternal death is very rare with either type of delivery. The risks of an elective repeat cesarean delivery (ERCD) were reviewed with the patient including but not limited to: 02/998 risk of uterine rupture which could have serious consequences, bleeding which may require transfusion; infection which may require antibiotics; injury to bowel, bladder or other surrounding organs (bowel, bladder, ureters); injury to the fetus; need for additional procedures including hysterectomy in the event of a life-threatening hemorrhage; thromboembolic phenomenon; abnormal placentation; incisional problems; death and other  postoperative or anesthesia complications.    Follow up in 4 weeks.   Hildred Laser, MD [facility]

## 2014-10-17 ENCOUNTER — Ambulatory Visit: Payer: Medicaid Other

## 2014-10-17 ENCOUNTER — Other Ambulatory Visit: Payer: Self-pay | Admitting: Obstetrics and Gynecology

## 2014-10-17 DIAGNOSIS — Z3687 Encounter for antenatal screening for uncertain dates: Secondary | ICD-10-CM

## 2014-10-17 DIAGNOSIS — Z331 Pregnant state, incidental: Secondary | ICD-10-CM

## 2014-10-17 DIAGNOSIS — Z36 Encounter for antenatal screening of mother: Secondary | ICD-10-CM | POA: Diagnosis not present

## 2014-10-17 DIAGNOSIS — Z8742 Personal history of other diseases of the female genital tract: Secondary | ICD-10-CM | POA: Diagnosis not present

## 2014-10-17 DIAGNOSIS — Z1379 Encounter for other screening for genetic and chromosomal anomalies: Secondary | ICD-10-CM

## 2014-10-17 DIAGNOSIS — Z369 Encounter for antenatal screening, unspecified: Secondary | ICD-10-CM

## 2014-10-17 DIAGNOSIS — Z8759 Personal history of other complications of pregnancy, childbirth and the puerperium: Secondary | ICD-10-CM

## 2014-10-21 LAB — FIRST TRIMESTER SCREEN W/NT
CRL: 62.4 mm
DIA MoM: 1.8
DIA Value: 471.7 pg/mL
GEST AGE-COLLECT: 12.6 wk
HCG VALUE: 194.4 [IU]/mL
MATERNAL AGE AT EDD: 31.6 a
NUCHAL TRANSLUCENCY MOM: 0.95
NUCHAL TRANSLUCENCY: 1.5 mm
NUMBER OF FETUSES: 1
PAPP-A MOM: 0.85
PAPP-A Value: 1008.2 ng/mL
TEST RESULTS: NEGATIVE
WEIGHT: 130 [lb_av]
hCG MoM: 1.92

## 2014-10-25 ENCOUNTER — Ambulatory Visit: Payer: Medicaid Other | Admitting: Women's Health

## 2014-11-07 ENCOUNTER — Ambulatory Visit (INDEPENDENT_AMBULATORY_CARE_PROVIDER_SITE_OTHER): Payer: Medicaid Other | Admitting: Obstetrics and Gynecology

## 2014-11-07 VITALS — BP 107/69 | HR 96 | Wt 140.2 lb

## 2014-11-07 DIAGNOSIS — Z3493 Encounter for supervision of normal pregnancy, unspecified, third trimester: Secondary | ICD-10-CM

## 2014-11-07 DIAGNOSIS — Z23 Encounter for immunization: Secondary | ICD-10-CM | POA: Diagnosis not present

## 2014-11-07 LAB — POCT URINALYSIS DIP (MANUAL ENTRY)
BILIRUBIN UA: NEGATIVE
Bilirubin, UA: NEGATIVE
Glucose, UA: NEGATIVE
Leukocytes, UA: NEGATIVE
Nitrite, UA: NEGATIVE
PROTEIN UA: NEGATIVE
RBC UA: NEGATIVE
SPEC GRAV UA: 1.02
Urobilinogen, UA: 0.2
pH, UA: 7.5

## 2014-11-07 NOTE — Progress Notes (Signed)
ROB: Denies major complaints.  Does note occasional low back pain.  Discussed stretches, warm compresses, Tylenol prn. Desires a TOLAC. RTC in 4 weeks.  For anatomy scan at next visit. For serum AFP at next visit.

## 2014-12-06 ENCOUNTER — Ambulatory Visit: Payer: Medicaid Other

## 2014-12-06 ENCOUNTER — Ambulatory Visit (INDEPENDENT_AMBULATORY_CARE_PROVIDER_SITE_OTHER): Payer: Medicaid Other | Admitting: Obstetrics and Gynecology

## 2014-12-06 VITALS — BP 121/79 | HR 90 | Wt 151.0 lb

## 2014-12-06 DIAGNOSIS — Z3492 Encounter for supervision of normal pregnancy, unspecified, second trimester: Secondary | ICD-10-CM

## 2014-12-06 DIAGNOSIS — Z3493 Encounter for supervision of normal pregnancy, unspecified, third trimester: Secondary | ICD-10-CM

## 2014-12-06 DIAGNOSIS — O34219 Maternal care for unspecified type scar from previous cesarean delivery: Secondary | ICD-10-CM

## 2014-12-06 LAB — POCT URINALYSIS DIPSTICK
BILIRUBIN UA: NEGATIVE
GLUCOSE UA: NEGATIVE
Ketones, UA: NEGATIVE
NITRITE UA: NEGATIVE
Protein, UA: NEGATIVE
Spec Grav, UA: 1.01
UROBILINOGEN UA: NEGATIVE
pH, UA: 7

## 2014-12-06 NOTE — Progress Notes (Signed)
ROB: Patient doing well, no complaints.  S/p anatomy scan with normal anatomy.  RTC in 4 weeks.

## 2014-12-06 NOTE — Patient Instructions (Signed)
Thank you for enrolling in MyChart. Please follow the instructions below to securely access your online medical record. MyChart allows you to send messages to your doctor, view your test results, renew your prescriptions, schedule appointments, and more.  How Do I Sign Up? In your Internet browser, go to [email or web address] 1. Click on the New  User? link in the Sign In box.  2. Enter your MyChart Access Code exactly as it appears below. You will not need to use this code after you have completed the sign-up process. If you do not sign up before the expiration date, you must request a new code. MyChart Access Code: [email or web address] Expires: 02/04/2015  2:57 PM  3. Enter the last four digits of your Social Security Number (xxxx) and Date of Birth (mm/dd/yyyy) as indicated and click Next. You will be taken to the next sign-up page. 4. Create a MyChart ID. This will be your MyChart login ID and cannot be changed, so think of one that is secure and easy to remember. 5. Create a MyChart password. You can change your password at any time. 6. Enter your Password Reset Question and Answer and click Next. This can be used at a later time if you forget your password.  7. Select your communication preference, and if applicable enter your e-mail address. You will receive e-mail notification when new information is available in MyChart by choosing to receive e-mail notifications and filling in your e-mail. 8. Click Sign In. You can now view your medical record.   Additional Information If you have questions, you can email [email or web address] or call [phone] to talk to our MyChart staff. Remember, MyChart is NOT to be used for urgent needs. For medical emergencies, dial 911.

## 2015-01-03 ENCOUNTER — Ambulatory Visit (INDEPENDENT_AMBULATORY_CARE_PROVIDER_SITE_OTHER): Payer: Medicaid Other | Admitting: Certified Nurse Midwife

## 2015-01-03 ENCOUNTER — Encounter: Payer: Self-pay | Admitting: Certified Nurse Midwife

## 2015-01-03 VITALS — BP 114/64 | HR 97 | Wt 160.6 lb

## 2015-01-03 DIAGNOSIS — Z1389 Encounter for screening for other disorder: Secondary | ICD-10-CM

## 2015-01-03 DIAGNOSIS — Z36 Encounter for antenatal screening of mother: Secondary | ICD-10-CM

## 2015-01-03 DIAGNOSIS — Z3482 Encounter for supervision of other normal pregnancy, second trimester: Secondary | ICD-10-CM

## 2015-01-03 DIAGNOSIS — Z369 Encounter for antenatal screening, unspecified: Secondary | ICD-10-CM

## 2015-01-03 DIAGNOSIS — Z3492 Encounter for supervision of normal pregnancy, unspecified, second trimester: Secondary | ICD-10-CM

## 2015-01-03 DIAGNOSIS — Z3480 Encounter for supervision of other normal pregnancy, unspecified trimester: Secondary | ICD-10-CM | POA: Insufficient documentation

## 2015-01-03 LAB — POCT URINALYSIS DIPSTICK
BILIRUBIN UA: NEGATIVE
Glucose, UA: NEGATIVE
Ketones, UA: NEGATIVE
NITRITE UA: NEGATIVE
PH UA: 6
Protein, UA: NEGATIVE
RBC UA: NEGATIVE
SPEC GRAV UA: 1.02
UROBILINOGEN UA: NEGATIVE

## 2015-01-03 NOTE — Patient Instructions (Signed)
Gestational Diabetes Mellitus  Gestational diabetes mellitus, often simply referred to as gestational diabetes, is a type of diabetes that some women develop during pregnancy. In gestational diabetes, the pancreas does not make enough insulin (a hormone), the cells are less responsive to the insulin that is made (insulin resistance), or both. Normally, insulin moves sugars from food into the tissue cells. The tissue cells use the sugars for energy. The lack of insulin or the lack of normal response to insulin causes excess sugars to build up in the blood instead of going into the tissue cells. As a result, high blood sugar (hyperglycemia) develops. The effect of high sugar (glucose) levels can cause many problems.   RISK FACTORS  You have an increased chance of developing gestational diabetes if you have a family history of diabetes and also have one or more of the following risk factors:  · A body mass index over 30 (obesity).  · A previous pregnancy with gestational diabetes.  · An older age at the time of pregnancy.  If blood glucose levels are kept in the normal range during pregnancy, women can have a healthy pregnancy. If your blood glucose levels are not well controlled, there may be risks to you, your unborn baby (fetus), your labor and delivery, or your newborn baby.   SYMPTOMS   If symptoms are experienced, they are much like symptoms you would normally expect during pregnancy. The symptoms of gestational diabetes include:   · Increased thirst (polydipsia).  · Increased urination (polyuria).  · Increased urination during the night (nocturia).  · Weight loss. This weight loss may be rapid.  · Frequent, recurring infections.  · Tiredness (fatigue).  · Weakness.  · Vision changes, such as blurred vision.  · Fruity smell to your breath.  · Abdominal pain.  DIAGNOSIS  Diabetes is diagnosed when blood glucose levels are increased. Your blood glucose level may be checked by one or more of the following blood  tests:  · A fasting blood glucose test. You will not be allowed to eat for at least 8 hours before a blood sample is taken.  · A random blood glucose test. Your blood glucose is checked at any time of the day regardless of when you ate.  · An oral glucose tolerance test (OGTT). Your blood glucose is measured after you have not eaten (fasted) for 1-3 hours and then after you drink a glucose-containing beverage. Since the hormones that cause insulin resistance are highest at about 24-28 weeks of a pregnancy, an OGTT is usually performed during that time. If you have risk factors, you may be screened for undiagnosed type 2 diabetes at your first prenatal visit.  TREATMENT   Gestational diabetes should be managed first with diet and exercise. Medicines may be added only if they are needed.  · You will need to take diabetes medicine or insulin daily to keep blood glucose levels in the desired range.  · You will need to match insulin dosing with exercise and healthy food choices.  If you have gestational diabetes, your treatment goal is to maintain the following blood glucose levels:  · Before meals (preprandial): at or below 95 mg/dL.  · After meals (postprandial):    One hour after a meal: at or below 140 mg/dL.    Two hours after a meal: at or below 120 mg/dL.  If you have pre-existing type 1 or type 2 diabetes, your treatment goal is to maintain the following blood glucose levels:  · Before   meals, at bedtime, and overnight: 60-99 mg/dL.  · After meals: peak of 100-129 mg/dL.  HOME CARE INSTRUCTIONS   · Have your hemoglobin A1c level checked twice a year.  · Perform daily blood glucose monitoring as directed by your health care provider. It is common to perform frequent blood glucose monitoring.  · Monitor urine ketones when you are ill and as directed by your health care provider.  · Take your diabetes medicine and insulin as directed by your health care provider to maintain your blood glucose level in the desired  range.  ¨ Never run out of diabetes medicine or insulin. It is needed every day.  ¨ Adjust insulin based on your intake of carbohydrates. Carbohydrates can raise blood glucose levels but need to be included in your diet. Carbohydrates provide vitamins, minerals, and fiber which are an essential part of a healthy diet. Carbohydrates are found in fruits, vegetables, whole grains, dairy products, legumes, and foods containing added sugars.  · Eat healthy foods. Alternate 3 meals with 3 snacks.  · Maintain a healthy weight gain. The usual total expected weight gain varies according to your prepregnancy body mass index (BMI).  · Carry a medical alert card or wear your medical alert jewelry.  · Carry a 15-gram carbohydrate snack with you at all times to treat low blood glucose (hypoglycemia). Some examples of 15-gram carbohydrate snacks include:  ¨ Glucose tablets, 3 or 4.  ¨ Glucose gel, 15-gram tube.  ¨ Raisins, 2 tablespoons (24 g).  ¨ Jelly beans, 6.  ¨ Animal crackers, 8.  ¨ Fruit juice, regular soda, or low-fat milk, 4 ounces (120 mL).  ¨ Gummy treats, 9.  · Recognize hypoglycemia. Hypoglycemia during pregnancy occurs with blood glucose levels of 60 mg/dL and below. The risk for hypoglycemia increases when fasting or skipping meals, during or after intense exercise, and during sleep. Hypoglycemia symptoms can include:  ¨ Tremors or shakes.  ¨ Decreased ability to concentrate.  ¨ Sweating.  ¨ Increased heart rate.  ¨ Headache.  ¨ Dry mouth.  ¨ Hunger.  ¨ Irritability.  ¨ Anxiety.  ¨ Restless sleep.  ¨ Altered speech or coordination.  ¨ Confusion.  · Treat hypoglycemia promptly. If you are alert and able to safely swallow, follow the 15:15 rule:  ¨ Take 15-20 grams of rapid-acting glucose or carbohydrate. Rapid-acting options include glucose gel, glucose tablets, or 4 ounces (120 mL) of fruit juice, regular soda, or low-fat milk.  ¨ Check your blood glucose level 15 minutes after taking the glucose.  ¨ Take 15-20  grams more of glucose if the repeat blood glucose level is still 70 mg/dL or below.  ¨ Eat a meal or snack within 1 hour once blood glucose levels return to normal.  · Be alert to polyuria (excess urination) and polydipsia (excess thirst) which are early signs of hyperglycemia. An early awareness of hyperglycemia allows for prompt treatment. Treat hyperglycemia as directed by your health care provider.  · Engage in at least 30 minutes of physical activity a day or as directed by your health care provider. Ten minutes of physical activity timed 30 minutes after each meal is encouraged to control postprandial blood glucose levels.  · Adjust your insulin dosing and food intake as needed if you start a new exercise or sport.  · Follow your sick-day plan at any time you are unable to eat or drink as usual.  · Avoid tobacco and alcohol use.  · Keep all follow-up visits as directed   by your health care provider.  · Follow the advice of your health care provider regarding your prenatal and post-delivery (postpartum) appointments, meal planning, exercise, medicines, vitamins, blood tests, other medical tests, and physical activities.  · Perform daily skin and foot care. Examine your skin and feet daily for cuts, bruises, redness, nail problems, bleeding, blisters, or sores.  · Brush your teeth and gums at least twice a day and floss at least once a day. Follow up with your dentist regularly.  · Schedule an eye exam during the first trimester of your pregnancy or as directed by your health care provider.  · Share your diabetes management plan with your workplace or school.  · Stay up-to-date with immunizations.  · Learn to manage stress.  · Obtain ongoing diabetes education and support as needed.  · Learn about and consider breastfeeding your baby.  · You should have your blood sugar level checked 6-12 weeks after delivery. This is done with an oral glucose tolerance test (OGTT).  SEEK MEDICAL CARE IF:   · You are unable to  eat food or drink fluids for more than 6 hours.  · You have nausea and vomiting for more than 6 hours.  · You have a blood glucose level of 200 mg/dL and you have ketones in your urine.  · There is a change in mental status.  · You develop vision problems.  · You have a persistent headache.  · You have upper abdominal pain or discomfort.  · You develop an additional serious illness.  · You have diarrhea for more than 6 hours.  · You have been sick or have had a fever for a couple of days and are not getting better.  SEEK IMMEDIATE MEDICAL CARE IF:   · You have difficulty breathing.  · You no longer feel the baby moving.  · You are bleeding or have discharge from your vagina.  · You start having premature contractions or labor.  MAKE SURE YOU:  · Understand these instructions.  · Will watch your condition.  · Will get help right away if you are not doing well or get worse.     This information is not intended to replace advice given to you by your health care provider. Make sure you discuss any questions you have with your health care provider.     Document Released: 04/07/2000 Document Revised: 01/20/2014 Document Reviewed: 07/29/2011  Elsevier Interactive Patient Education ©2016 [employer]

## 2015-01-03 NOTE — Progress Notes (Signed)
ROB-GTT next visit.  Discussed weight gain & healthy diet.  Pt encouraged to stop juice.  Having a boy "Veronica Whitaker"  Plans circ & breastfeeding,

## 2015-01-14 NOTE — L&D Delivery Note (Signed)
Delivery Summary for Praxairlexis Love  Labor Events:   Preterm labor:   Rupture date:   Rupture time:   Rupture type: Spontaneous  Fluid Color: Clear  Induction:   Augmentation:   Complications:   Cervical ripening:          Delivery:   Episiotomy:   Lacerations:   Repair suture:   Repair # of packets:   Blood loss (ml):    Information for the patient's newborn:  Rolan BuccoLove, Boy Denea [[id number]][[id number]]    Delivery 04/30/2015 11:57 PM by  C-Section, Low Transverse Sex:  female Gestational Age: 627w1d Delivery Clinician:  Hildred LaserAnika Jacob Cicero Living?: Yes        APGARS  One minute Five minutes Ten minutes  Skin color: 0   1      Heart rate: 2   2      Grimace: 2   2      Muscle tone: 2   2      Breathing: 2   2      Totals: 8  9      Presentation/position: Vertex     Resuscitation: None  Cord information:    Disposition of cord blood:     Blood gases sent?  Complications:   Placenta: Delivered: 04/30/2015 11:58 PM     appearance Newborn Measurements: Weight: 9 lb 2.4 oz (4150 g)  Height: 22.05"  Head circumference: 35 cm  Chest circumference: 35 cm  Other providers:    Additional  information: Forceps:   Vacuum:   Breech:   Observed anomalies        See Dr. Oretha Milchherry's C-section Operative note for details of procedure.    Hildred LaserAnika Marina Desire, MD [facility]

## 2015-01-31 ENCOUNTER — Ambulatory Visit (INDEPENDENT_AMBULATORY_CARE_PROVIDER_SITE_OTHER): Payer: Medicaid Other | Admitting: Obstetrics and Gynecology

## 2015-01-31 ENCOUNTER — Other Ambulatory Visit: Payer: Medicaid Other

## 2015-01-31 ENCOUNTER — Other Ambulatory Visit: Payer: Self-pay | Admitting: Obstetrics and Gynecology

## 2015-01-31 VITALS — BP 100/66 | HR 102 | Wt 171.3 lb

## 2015-01-31 DIAGNOSIS — Z3492 Encounter for supervision of normal pregnancy, unspecified, second trimester: Secondary | ICD-10-CM

## 2015-01-31 DIAGNOSIS — Z3482 Encounter for supervision of other normal pregnancy, second trimester: Secondary | ICD-10-CM | POA: Diagnosis not present

## 2015-01-31 DIAGNOSIS — Z23 Encounter for immunization: Secondary | ICD-10-CM | POA: Diagnosis not present

## 2015-01-31 LAB — POCT URINALYSIS DIPSTICK
BILIRUBIN UA: NEGATIVE
Glucose, UA: NEGATIVE
Ketones, UA: NEGATIVE
NITRITE UA: NEGATIVE
PH UA: 8
Protein, UA: NEGATIVE
RBC UA: NEGATIVE
UROBILINOGEN UA: NEGATIVE

## 2015-01-31 NOTE — Progress Notes (Signed)
ROB: Patient doing well.  Beginning to note occasional Veronica Whitaker.  PTL precautions given.  For glucola today, Tdap and blood consent.  Discussed cord blood banking.  Unsure about contraceptive desires.  Will address again at future visits. RTC in 2 weeks.

## 2015-02-01 ENCOUNTER — Telehealth: Payer: Self-pay | Admitting: Obstetrics and Gynecology

## 2015-02-01 LAB — CBC
HEMATOCRIT: 35.1 % (ref 34.0–46.6)
HEMOGLOBIN: 11.8 g/dL (ref 11.1–15.9)
MCH: 28 pg (ref 26.6–33.0)
MCHC: 33.6 g/dL (ref 31.5–35.7)
MCV: 83 fL (ref 79–97)
NRBC: 0 % (ref 0–0)
Platelets: 310 10*3/uL (ref 150–379)
RBC: 4.21 x10E6/uL (ref 3.77–5.28)
RDW: 13.9 % (ref 12.3–15.4)
WBC: 12.7 10*3/uL — AB (ref 3.4–10.8)

## 2015-02-01 LAB — GLUCOSE, 1 HOUR GESTATIONAL: GESTATIONAL DIABETES SCREEN: 80 mg/dL (ref 65–139)

## 2015-02-01 NOTE — Telephone Encounter (Signed)
PT CALLED AND SHE WAS WANTING TO KNOW THE RESULTS OF HER GLUCOSE

## 2015-02-01 NOTE — Telephone Encounter (Signed)
Please advise pt that results are not back (was just done yesterday) we will inform her of results as soon as they return.

## 2015-02-01 NOTE — Telephone Encounter (Signed)
PT AWARE  

## 2015-02-13 ENCOUNTER — Encounter: Payer: Medicaid Other | Admitting: Obstetrics and Gynecology

## 2015-02-15 ENCOUNTER — Ambulatory Visit (INDEPENDENT_AMBULATORY_CARE_PROVIDER_SITE_OTHER): Payer: Medicaid Other | Admitting: Obstetrics and Gynecology

## 2015-02-15 VITALS — BP 125/69 | HR 100 | Wt 175.9 lb

## 2015-02-15 DIAGNOSIS — Z3493 Encounter for supervision of normal pregnancy, unspecified, third trimester: Secondary | ICD-10-CM

## 2015-02-15 DIAGNOSIS — Z3483 Encounter for supervision of other normal pregnancy, third trimester: Secondary | ICD-10-CM

## 2015-02-15 LAB — POCT URINALYSIS DIPSTICK
Bilirubin, UA: NEGATIVE
Blood, UA: NEGATIVE
GLUCOSE UA: NEGATIVE
Ketones, UA: NEGATIVE
LEUKOCYTES UA: NEGATIVE
NITRITE UA: NEGATIVE
Protein, UA: NEGATIVE
SPEC GRAV UA: 1.01
UROBILINOGEN UA: NEGATIVE
pH, UA: 7.5

## 2015-02-15 NOTE — Progress Notes (Signed)
ROB: Patient doing well.  Continues to note occasional [employer].  PTL precautions given.  Normal glucola (however +1 glucosuria).  Still unsure about contraceptive desires. Will address again at future visits.Total weight gain 40 lbs.  Discussed no further weight gain in pregnancy, dietary changes and exercises in pregnancy.   RTC in 2 weeks.

## 2015-02-28 ENCOUNTER — Ambulatory Visit (INDEPENDENT_AMBULATORY_CARE_PROVIDER_SITE_OTHER): Payer: Medicaid Other | Admitting: Obstetrics and Gynecology

## 2015-02-28 VITALS — BP 131/78 | HR 111 | Wt 179.4 lb

## 2015-02-28 DIAGNOSIS — O1203 Gestational edema, third trimester: Secondary | ICD-10-CM

## 2015-02-28 DIAGNOSIS — O34219 Maternal care for unspecified type scar from previous cesarean delivery: Secondary | ICD-10-CM

## 2015-02-28 DIAGNOSIS — Z3483 Encounter for supervision of other normal pregnancy, third trimester: Secondary | ICD-10-CM

## 2015-02-28 DIAGNOSIS — Z3493 Encounter for supervision of normal pregnancy, unspecified, third trimester: Secondary | ICD-10-CM

## 2015-02-28 LAB — POCT URINALYSIS DIPSTICK
BILIRUBIN UA: NEGATIVE
Ketones, UA: NEGATIVE
LEUKOCYTES UA: NEGATIVE
NITRITE UA: NEGATIVE
Protein, UA: NEGATIVE
Spec Grav, UA: 1.01
Urobilinogen, UA: NEGATIVE
pH, UA: 7.5

## 2015-02-28 NOTE — Progress Notes (Signed)
ROB: Patient notes that she has begun exercising (walking 30 min daily). Patient notes swelling in hands and feet.  +1 pitting edema noted.  BPs wnl (however elevated for patient).  Will continue to monitor for s/s of pre-eclampsia. RTC in 2 weeks.

## 2015-03-15 ENCOUNTER — Ambulatory Visit (INDEPENDENT_AMBULATORY_CARE_PROVIDER_SITE_OTHER): Payer: Medicaid Other | Admitting: Obstetrics and Gynecology

## 2015-03-15 VITALS — BP 124/72 | HR 116 | Wt 184.7 lb

## 2015-03-15 DIAGNOSIS — Z3483 Encounter for supervision of other normal pregnancy, third trimester: Secondary | ICD-10-CM

## 2015-03-15 DIAGNOSIS — O34219 Maternal care for unspecified type scar from previous cesarean delivery: Secondary | ICD-10-CM

## 2015-03-15 DIAGNOSIS — O2603 Excessive weight gain in pregnancy, third trimester: Secondary | ICD-10-CM

## 2015-03-15 LAB — POCT URINALYSIS DIPSTICK
Bilirubin, UA: NEGATIVE
Glucose, UA: NEGATIVE
KETONES UA: NEGATIVE
Nitrite, UA: NEGATIVE
Protein, UA: NEGATIVE
RBC UA: NEGATIVE
SPEC GRAV UA: 1.015
UROBILINOGEN UA: NEGATIVE
pH, UA: 7

## 2015-03-15 NOTE — Progress Notes (Signed)
ROB: Patient doing well, no complaints. Does report increase pressure. Continues to gain weight.  Placed on 2200 kcal diet.  Given food diary to record x 3 days, counseled on calorie app on phone. PTL precautions and fetal kick counts advised. RTC in 2 weeks. Tachycardia present, asymptomatic.  Regular rhythym.

## 2015-03-29 ENCOUNTER — Ambulatory Visit (INDEPENDENT_AMBULATORY_CARE_PROVIDER_SITE_OTHER): Payer: Medicaid Other | Admitting: Obstetrics and Gynecology

## 2015-03-29 VITALS — BP 102/70 | HR 108 | Wt 187.3 lb

## 2015-03-29 DIAGNOSIS — Z113 Encounter for screening for infections with a predominantly sexual mode of transmission: Secondary | ICD-10-CM

## 2015-03-29 DIAGNOSIS — O34219 Maternal care for unspecified type scar from previous cesarean delivery: Secondary | ICD-10-CM

## 2015-03-29 DIAGNOSIS — Z3483 Encounter for supervision of other normal pregnancy, third trimester: Secondary | ICD-10-CM

## 2015-03-29 DIAGNOSIS — O2603 Excessive weight gain in pregnancy, third trimester: Secondary | ICD-10-CM

## 2015-03-29 DIAGNOSIS — Z369 Encounter for antenatal screening, unspecified: Secondary | ICD-10-CM

## 2015-03-29 DIAGNOSIS — Z3493 Encounter for supervision of normal pregnancy, unspecified, third trimester: Secondary | ICD-10-CM

## 2015-03-29 DIAGNOSIS — Z36 Encounter for antenatal screening of mother: Secondary | ICD-10-CM

## 2015-03-29 DIAGNOSIS — O329XX0 Maternal care for malpresentation of fetus, unspecified, not applicable or unspecified: Secondary | ICD-10-CM

## 2015-03-29 LAB — POCT URINALYSIS DIPSTICK
BILIRUBIN UA: NEGATIVE
GLUCOSE UA: NEGATIVE
KETONES UA: NEGATIVE
Nitrite, UA: NEGATIVE
Protein, UA: NEGATIVE
RBC UA: NEGATIVE
SPEC GRAV UA: 1.015
UROBILINOGEN UA: NEGATIVE
pH, UA: 7

## 2015-03-29 LAB — OB RESULTS CONSOLE GBS: GBS: NEGATIVE

## 2015-03-29 NOTE — Progress Notes (Signed)
ROB: Patient doing well, denies complaints.  Notes that she has been monitoring diet and making better food choices, is under 2000 calories. Unable to determine presentation today, will get scan for next visit.  36 week labs done today.  RTC in 1 week.

## 2015-03-31 LAB — GC/CHLAMYDIA PROBE AMP
CHLAMYDIA, DNA PROBE: NEGATIVE
NEISSERIA GONORRHOEAE BY PCR: NEGATIVE

## 2015-04-02 LAB — CULTURE, BETA STREP (GROUP B ONLY): STREP GP B CULTURE: NEGATIVE

## 2015-04-04 ENCOUNTER — Ambulatory Visit (INDEPENDENT_AMBULATORY_CARE_PROVIDER_SITE_OTHER): Payer: Medicaid Other

## 2015-04-04 ENCOUNTER — Other Ambulatory Visit: Payer: Self-pay | Admitting: Obstetrics and Gynecology

## 2015-04-04 ENCOUNTER — Ambulatory Visit (INDEPENDENT_AMBULATORY_CARE_PROVIDER_SITE_OTHER): Payer: Medicaid Other | Admitting: Obstetrics and Gynecology

## 2015-04-04 ENCOUNTER — Encounter: Payer: Self-pay | Admitting: Obstetrics and Gynecology

## 2015-04-04 VITALS — BP 116/70 | HR 101 | Wt 187.5 lb

## 2015-04-04 DIAGNOSIS — O2603 Excessive weight gain in pregnancy, third trimester: Secondary | ICD-10-CM

## 2015-04-04 DIAGNOSIS — O329XX0 Maternal care for malpresentation of fetus, unspecified, not applicable or unspecified: Secondary | ICD-10-CM

## 2015-04-04 DIAGNOSIS — O3663X0 Maternal care for excessive fetal growth, third trimester, not applicable or unspecified: Secondary | ICD-10-CM | POA: Diagnosis not present

## 2015-04-04 DIAGNOSIS — Z3483 Encounter for supervision of other normal pregnancy, third trimester: Secondary | ICD-10-CM

## 2015-04-04 DIAGNOSIS — Z3493 Encounter for supervision of normal pregnancy, unspecified, third trimester: Secondary | ICD-10-CM

## 2015-04-04 DIAGNOSIS — O34219 Maternal care for unspecified type scar from previous cesarean delivery: Secondary | ICD-10-CM

## 2015-04-04 LAB — POCT URINALYSIS DIPSTICK
Bilirubin, UA: NEGATIVE
Blood, UA: NEGATIVE
Glucose, UA: NEGATIVE
Ketones, UA: NEGATIVE
Nitrite, UA: NEGATIVE
PROTEIN UA: NEGATIVE
Spec Grav, UA: 1.01
UROBILINOGEN UA: NEGATIVE
pH, UA: 7

## 2015-04-04 NOTE — Progress Notes (Signed)
ROB: Patient doing well, no complaints. 36 week labs negative. No further weight gain noted, patient is monitoring food intake, is walking daily. Scan performed for presentation and growth, is vertex, weight is 6 lb 15 oz. RTC weekly until delivery.

## 2015-04-12 ENCOUNTER — Ambulatory Visit (INDEPENDENT_AMBULATORY_CARE_PROVIDER_SITE_OTHER): Payer: Medicaid Other | Admitting: Obstetrics and Gynecology

## 2015-04-12 ENCOUNTER — Encounter: Payer: Self-pay | Admitting: Obstetrics and Gynecology

## 2015-04-12 VITALS — BP 106/64 | HR 102 | Wt 191.9 lb

## 2015-04-12 DIAGNOSIS — Z3483 Encounter for supervision of other normal pregnancy, third trimester: Secondary | ICD-10-CM

## 2015-04-12 DIAGNOSIS — O2603 Excessive weight gain in pregnancy, third trimester: Secondary | ICD-10-CM

## 2015-04-12 DIAGNOSIS — O1203 Gestational edema, third trimester: Secondary | ICD-10-CM

## 2015-04-12 DIAGNOSIS — Z3493 Encounter for supervision of normal pregnancy, unspecified, third trimester: Secondary | ICD-10-CM

## 2015-04-12 DIAGNOSIS — O34219 Maternal care for unspecified type scar from previous cesarean delivery: Secondary | ICD-10-CM

## 2015-04-12 LAB — POCT URINALYSIS DIPSTICK
Bilirubin, UA: NEGATIVE
GLUCOSE UA: NEGATIVE
Ketones, UA: NEGATIVE
NITRITE UA: NEGATIVE
PH UA: 7
Protein, UA: NEGATIVE
Spec Grav, UA: 1.01
UROBILINOGEN UA: NEGATIVE

## 2015-04-12 NOTE — Progress Notes (Signed)
ROB: Patient complains of increased pelvic pressure, and irregular contractions.  Labor precautions given. Initial BP elevated with repeat normal (106/64).

## 2015-04-17 ENCOUNTER — Encounter: Payer: Medicaid Other | Admitting: Obstetrics and Gynecology

## 2015-04-19 ENCOUNTER — Ambulatory Visit (INDEPENDENT_AMBULATORY_CARE_PROVIDER_SITE_OTHER): Payer: Medicaid Other | Admitting: Obstetrics and Gynecology

## 2015-04-19 ENCOUNTER — Encounter: Payer: Self-pay | Admitting: Obstetrics and Gynecology

## 2015-04-19 VITALS — BP 111/73 | HR 101 | Wt 195.4 lb

## 2015-04-19 DIAGNOSIS — Z3493 Encounter for supervision of normal pregnancy, unspecified, third trimester: Secondary | ICD-10-CM

## 2015-04-19 DIAGNOSIS — O2603 Excessive weight gain in pregnancy, third trimester: Secondary | ICD-10-CM

## 2015-04-19 DIAGNOSIS — N390 Urinary tract infection, site not specified: Secondary | ICD-10-CM

## 2015-04-19 DIAGNOSIS — O34219 Maternal care for unspecified type scar from previous cesarean delivery: Secondary | ICD-10-CM

## 2015-04-19 DIAGNOSIS — Z3483 Encounter for supervision of other normal pregnancy, third trimester: Secondary | ICD-10-CM

## 2015-04-19 DIAGNOSIS — R82998 Other abnormal findings in urine: Secondary | ICD-10-CM

## 2015-04-19 DIAGNOSIS — Z1389 Encounter for screening for other disorder: Secondary | ICD-10-CM

## 2015-04-19 LAB — POCT URINALYSIS DIPSTICK
BILIRUBIN UA: NEGATIVE
Ketones, UA: NEGATIVE
Nitrite, UA: NEGATIVE
PH UA: 6.5
RBC UA: NEGATIVE
Spec Grav, UA: 1.015
Urobilinogen, UA: NEGATIVE

## 2015-04-19 NOTE — Progress Notes (Signed)
ROB: Patient still noting increased pelvic pressure, irregular ctx.  Continue labor precautions.  Patient to return in 1 week.

## 2015-04-19 NOTE — Addendum Note (Signed)
Addended by: Jackquline DenmarkIDGEWAY, Alixander Rallis W on: 04/19/2015 04:40 PM   Modules accepted: Orders

## 2015-04-21 LAB — URINE CULTURE, OB REFLEX

## 2015-04-21 LAB — CULTURE, OB URINE

## 2015-04-25 ENCOUNTER — Ambulatory Visit (INDEPENDENT_AMBULATORY_CARE_PROVIDER_SITE_OTHER): Payer: Medicaid Other | Admitting: Obstetrics and Gynecology

## 2015-04-25 VITALS — BP 111/72 | HR 105 | Wt 196.6 lb

## 2015-04-25 DIAGNOSIS — Z3493 Encounter for supervision of normal pregnancy, unspecified, third trimester: Secondary | ICD-10-CM

## 2015-04-25 DIAGNOSIS — O1203 Gestational edema, third trimester: Secondary | ICD-10-CM

## 2015-04-25 DIAGNOSIS — O34219 Maternal care for unspecified type scar from previous cesarean delivery: Secondary | ICD-10-CM

## 2015-04-25 DIAGNOSIS — Z3483 Encounter for supervision of other normal pregnancy, third trimester: Secondary | ICD-10-CM

## 2015-04-25 DIAGNOSIS — O2603 Excessive weight gain in pregnancy, third trimester: Secondary | ICD-10-CM

## 2015-04-25 LAB — POCT URINALYSIS DIPSTICK
BILIRUBIN UA: NEGATIVE
Blood, UA: NEGATIVE
Glucose, UA: NEGATIVE
KETONES UA: NEGATIVE
Nitrite, UA: NEGATIVE
SPEC GRAV UA: 1.01
Urobilinogen, UA: NEGATIVE
pH, UA: 7

## 2015-04-25 NOTE — Progress Notes (Signed)
ROB: Patient complaints of irregular contractions, still noting pressure.  Continue labor precautions.  RTC in 1 week.  Will need NST at that time if undelivered.

## 2015-04-30 ENCOUNTER — Inpatient Hospital Stay: Payer: Medicaid Other | Admitting: Anesthesiology

## 2015-04-30 ENCOUNTER — Inpatient Hospital Stay
Admission: EM | Admit: 2015-04-30 | Discharge: 2015-05-02 | DRG: 766 | Disposition: A | Discharge status: Home / Self Care | Payer: Medicaid Other | Source: Ambulatory Visit | Attending: Obstetrics and Gynecology | Admitting: Obstetrics and Gynecology

## 2015-04-30 ENCOUNTER — Encounter
Admission: EM | Disposition: A | Discharge status: Home / Self Care | Payer: Self-pay | Source: Ambulatory Visit | Attending: Obstetrics and Gynecology

## 2015-04-30 ENCOUNTER — Encounter: Payer: Self-pay | Admitting: *Deleted

## 2015-04-30 DIAGNOSIS — O4292 Full-term premature rupture of membranes, unspecified as to length of time between rupture and onset of labor: Secondary | ICD-10-CM | POA: Diagnosis present

## 2015-04-30 DIAGNOSIS — Z833 Family history of diabetes mellitus: Secondary | ICD-10-CM

## 2015-04-30 DIAGNOSIS — O2600 Excessive weight gain in pregnancy, unspecified trimester: Secondary | ICD-10-CM | POA: Diagnosis present

## 2015-04-30 DIAGNOSIS — Z823 Family history of stroke: Secondary | ICD-10-CM

## 2015-04-30 DIAGNOSIS — Z79899 Other long term (current) drug therapy: Secondary | ICD-10-CM | POA: Diagnosis not present

## 2015-04-30 DIAGNOSIS — O3663X0 Maternal care for excessive fetal growth, third trimester, not applicable or unspecified: Secondary | ICD-10-CM | POA: Diagnosis present

## 2015-04-30 DIAGNOSIS — Z3493 Encounter for supervision of normal pregnancy, unspecified, third trimester: Secondary | ICD-10-CM | POA: Diagnosis not present

## 2015-04-30 DIAGNOSIS — O9989 Other specified diseases and conditions complicating pregnancy, childbirth and the puerperium: Secondary | ICD-10-CM

## 2015-04-30 DIAGNOSIS — Z283 Underimmunization status: Secondary | ICD-10-CM

## 2015-04-30 DIAGNOSIS — O09899 Supervision of other high risk pregnancies, unspecified trimester: Secondary | ICD-10-CM | POA: Diagnosis present

## 2015-04-30 DIAGNOSIS — O34211 Maternal care for low transverse scar from previous cesarean delivery: Secondary | ICD-10-CM | POA: Diagnosis present

## 2015-04-30 DIAGNOSIS — Z98891 History of uterine scar from previous surgery: Secondary | ICD-10-CM

## 2015-04-30 DIAGNOSIS — Z3A40 40 weeks gestation of pregnancy: Secondary | ICD-10-CM | POA: Diagnosis not present

## 2015-04-30 DIAGNOSIS — F1290 Cannabis use, unspecified, uncomplicated: Secondary | ICD-10-CM | POA: Diagnosis present

## 2015-04-30 DIAGNOSIS — O34219 Maternal care for unspecified type scar from previous cesarean delivery: Secondary | ICD-10-CM | POA: Diagnosis present

## 2015-04-30 DIAGNOSIS — Z803 Family history of malignant neoplasm of breast: Secondary | ICD-10-CM | POA: Diagnosis not present

## 2015-04-30 DIAGNOSIS — Z8249 Family history of ischemic heart disease and other diseases of the circulatory system: Secondary | ICD-10-CM | POA: Diagnosis not present

## 2015-04-30 DIAGNOSIS — Z2839 Other underimmunization status: Secondary | ICD-10-CM | POA: Diagnosis present

## 2015-04-30 DIAGNOSIS — Z3483 Encounter for supervision of other normal pregnancy, third trimester: Secondary | ICD-10-CM

## 2015-04-30 LAB — TYPE AND SCREEN
ABO/RH(D): B POS
Antibody Screen: NEGATIVE

## 2015-04-30 LAB — RAPID HIV SCREEN (HIV 1/2 AB+AG)
HIV 1/2 ANTIBODIES: NONREACTIVE
HIV-1 P24 Antigen - HIV24: NONREACTIVE

## 2015-04-30 LAB — URINE DRUG SCREEN, QUALITATIVE (ARMC ONLY)
Amphetamines, Ur Screen: NOT DETECTED
Barbiturates, Ur Screen: NOT DETECTED
Benzodiazepine, Ur Scrn: NOT DETECTED
COCAINE METABOLITE, UR ~~LOC~~: NOT DETECTED
Cannabinoid 50 Ng, Ur ~~LOC~~: NOT DETECTED
MDMA (ECSTASY) UR SCREEN: NOT DETECTED
METHADONE SCREEN, URINE: NOT DETECTED
OPIATE, UR SCREEN: NOT DETECTED
Phencyclidine (PCP) Ur S: NOT DETECTED
TRICYCLIC, UR SCREEN: NOT DETECTED

## 2015-04-30 LAB — CBC
HCT: 40.2 % (ref 35.0–47.0)
Hemoglobin: 13.5 g/dL (ref 12.0–16.0)
MCH: 27.6 pg (ref 26.0–34.0)
MCHC: 33.5 g/dL (ref 32.0–36.0)
MCV: 82.3 fL (ref 80.0–100.0)
PLATELETS: 230 10*3/uL (ref 150–440)
RBC: 4.89 MIL/uL (ref 3.80–5.20)
RDW: 13.9 % (ref 11.5–14.5)
WBC: 11 10*3/uL (ref 3.6–11.0)

## 2015-04-30 LAB — ABO/RH: ABO/RH(D): B POS

## 2015-04-30 SURGERY — Surgical Case
Anesthesia: Epidural | Site: Abdomen | Wound class: Clean Contaminated

## 2015-04-30 MED ORDER — FENTANYL 2.5 MCG/ML W/ROPIVACAINE 0.2% IN NS 100 ML EPIDURAL INFUSION (ARMC-ANES)
10.0000 mL/h | EPIDURAL | Status: DC
  Administered 2015-04-30: 10 mL/h via EPIDURAL

## 2015-04-30 MED ORDER — OXYCODONE-ACETAMINOPHEN 5-325 MG PO TABS: 1.0000 | ORAL_TABLET | ORAL | Status: DC | PRN

## 2015-04-30 MED ORDER — CITRIC ACID-SODIUM CITRATE 334-500 MG/5ML PO SOLN
30.0000 mL | ORAL | Status: DC | PRN
  Administered 2015-04-30: 30 mL via ORAL
  Filled 2015-04-30: qty 15

## 2015-04-30 MED ORDER — BUTORPHANOL TARTRATE 1 MG/ML IJ SOLN
1.0000 mg | INTRAMUSCULAR | Status: DC | PRN
  Administered 2015-04-30: 1 mg via INTRAVENOUS
  Filled 2015-04-30: qty 1

## 2015-04-30 MED ORDER — AMMONIA AROMATIC IN INHA
RESPIRATORY_TRACT | Status: AC
  Filled 2015-04-30: qty 10

## 2015-04-30 MED ORDER — LIDOCAINE HCL (PF) 1 % IJ SOLN
INTRAMUSCULAR | Status: DC | PRN
  Administered 2015-04-30: 1 mL via INTRADERMAL

## 2015-04-30 MED ORDER — DIPHENHYDRAMINE HCL 50 MG/ML IJ SOLN: 12.5000 mg | INTRAMUSCULAR | Status: DC | PRN

## 2015-04-30 MED ORDER — MISOPROSTOL 200 MCG PO TABS
ORAL_TABLET | ORAL | Status: AC
  Filled 2015-04-30: qty 4

## 2015-04-30 MED ORDER — LACTATED RINGERS IV SOLN
500.0000 mL | INTRAVENOUS | Status: DC | PRN
  Administered 2015-04-30: 500 mL via INTRAVENOUS

## 2015-04-30 MED ORDER — LIDOCAINE 5 % EX PTCH: 1.0000 | MEDICATED_PATCH | CUTANEOUS | Status: DC

## 2015-04-30 MED ORDER — ACETAMINOPHEN 325 MG PO TABS: 650.0000 mg | ORAL_TABLET | ORAL | Status: DC | PRN

## 2015-04-30 MED ORDER — ONDANSETRON HCL 4 MG/2ML IJ SOLN: 4.0000 mg | Freq: Four times a day (QID) | INTRAMUSCULAR | Status: DC | PRN

## 2015-04-30 MED ORDER — OXYCODONE-ACETAMINOPHEN 5-325 MG PO TABS: 2.0000 | ORAL_TABLET | ORAL | Status: DC | PRN

## 2015-04-30 MED ORDER — CEFAZOLIN SODIUM-DEXTROSE 2-4 GM/100ML-% IV SOLN
INTRAVENOUS | Status: AC
  Administered 2015-04-30: 2 g via INTRAVENOUS
  Filled 2015-04-30: qty 100

## 2015-04-30 MED ORDER — TERBUTALINE SULFATE 1 MG/ML IJ SOLN
0.2500 mg | Freq: Once | INTRAMUSCULAR | Status: AC | PRN
  Administered 2015-04-30: 0.25 mg via SUBCUTANEOUS

## 2015-04-30 MED ORDER — CEFAZOLIN SODIUM-DEXTROSE 2-4 GM/100ML-% IV SOLN
2.0000 g | INTRAVENOUS | Status: AC
  Administered 2015-04-30: 2 g via INTRAVENOUS

## 2015-04-30 MED ORDER — BUTORPHANOL TARTRATE 1 MG/ML IJ SOLN
INTRAMUSCULAR | Status: AC
  Administered 2015-04-30: 2 mg via INTRAVENOUS
  Filled 2015-04-30: qty 2

## 2015-04-30 MED ORDER — OXYTOCIN BOLUS FROM INFUSION: 500.0000 mL | INTRAVENOUS | Status: DC

## 2015-04-30 MED ORDER — LACTATED RINGERS IV SOLN
INTRAVENOUS | Status: DC
  Administered 2015-04-30 – 2015-05-01 (×4): via INTRAVENOUS

## 2015-04-30 MED ORDER — LIDOCAINE-EPINEPHRINE (PF) 1.5 %-1:200000 IJ SOLN
INTRAMUSCULAR | Status: DC | PRN
  Administered 2015-04-30: 3 mL via EPIDURAL

## 2015-04-30 MED ORDER — ONDANSETRON HCL 4 MG/2ML IJ SOLN: 4.0000 mg | Freq: Three times a day (TID) | INTRAMUSCULAR | Status: DC | PRN

## 2015-04-30 MED ORDER — BUTORPHANOL TARTRATE 1 MG/ML IJ SOLN
2.0000 mg | INTRAMUSCULAR | Status: DC | PRN
  Administered 2015-04-30: 2 mg via INTRAVENOUS

## 2015-04-30 MED ORDER — TERBUTALINE SULFATE 1 MG/ML IJ SOLN
INTRAMUSCULAR | Status: AC
  Administered 2015-04-30: 0.25 mg via SUBCUTANEOUS
  Filled 2015-04-30: qty 1

## 2015-04-30 MED ORDER — BUPIVACAINE HCL (PF) 0.25 % IJ SOLN
INTRAMUSCULAR | Status: DC | PRN
  Administered 2015-04-30: 5 mL via EPIDURAL

## 2015-04-30 MED ORDER — CITRIC ACID-SODIUM CITRATE 334-500 MG/5ML PO SOLN
30.0000 mL | ORAL | Status: AC
  Administered 2015-04-30: 30 mL via ORAL

## 2015-04-30 MED ORDER — OXYTOCIN 40 UNITS IN LACTATED RINGERS INFUSION - SIMPLE MED
2.5000 [IU]/h | INTRAVENOUS | Status: DC
  Administered 2015-04-30: 1 mL via INTRAVENOUS
  Administered 2015-05-01: 299 mL via INTRAVENOUS
  Filled 2015-04-30 (×2): qty 1000

## 2015-04-30 MED ORDER — DIPHENHYDRAMINE HCL 25 MG PO CAPS: 25.0000 mg | ORAL_CAPSULE | ORAL | Status: DC | PRN

## 2015-04-30 MED ORDER — FENTANYL 2.5 MCG/ML W/ROPIVACAINE 0.2% IN NS 100 ML EPIDURAL INFUSION (ARMC-ANES)
EPIDURAL | Status: AC
  Filled 2015-04-30: qty 100

## 2015-04-30 MED ORDER — LIDOCAINE HCL (PF) 1 % IJ SOLN
30.0000 mL | INTRAMUSCULAR | Status: DC | PRN
  Filled 2015-04-30: qty 30

## 2015-04-30 MED ORDER — LIDOCAINE HCL (PF) 2 % IJ SOLN
INTRAMUSCULAR | Status: DC | PRN
  Administered 2015-04-30 (×4): 100 mg via EPIDURAL
  Administered 2015-05-01: 100 mg via INTRADERMAL

## 2015-04-30 SURGICAL SUPPLY — 23 items
BAG COUNTER SPONGE EZ (MISCELLANEOUS) ×2 IMPLANT
BAG SPNG 4X4 CLR HAZ (MISCELLANEOUS) ×1
CANISTER SUCT 3000ML (MISCELLANEOUS) ×2 IMPLANT
CHLORAPREP W/TINT 26ML (MISCELLANEOUS) ×4 IMPLANT
DRSG TELFA 3X8 NADH (GAUZE/BANDAGES/DRESSINGS) ×2 IMPLANT
ELECT REM PT RETURN 9FT ADLT (ELECTROSURGICAL) ×2
ELECTRODE REM PT RTRN 9FT ADLT (ELECTROSURGICAL) ×1 IMPLANT
GAUZE SPONGE 4X4 12PLY STRL (GAUZE/BANDAGES/DRESSINGS) ×2 IMPLANT
GLOVE BIO SURGEON STRL SZ 6 (GLOVE) ×6 IMPLANT
GLOVE BIOGEL PI IND STRL 6.5 (GLOVE) ×1 IMPLANT
GLOVE BIOGEL PI INDICATOR 6.5 (GLOVE) ×4
GOWN STRL REUS W/ TWL LRG LVL3 (GOWN DISPOSABLE) ×3 IMPLANT
GOWN STRL REUS W/TWL LRG LVL3 (GOWN DISPOSABLE) ×6
KIT RM TURNOVER STRD PROC AR (KITS) ×2 IMPLANT
NS IRRIG 1000ML POUR BTL (IV SOLUTION) ×2 IMPLANT
PACK C SECTION AR (MISCELLANEOUS) ×2 IMPLANT
PAD OB MATERNITY 4.3X12.25 (PERSONAL CARE ITEMS) ×2 IMPLANT
PAD PREP 24X41 OB/GYN DISP (PERSONAL CARE ITEMS) ×2 IMPLANT
SUT MNCRL AB 4-0 PS2 18 (SUTURE) ×2 IMPLANT
SUT PLAIN 2 0 XLH (SUTURE) IMPLANT
SUT VIC AB 0 CT1 36 (SUTURE) ×8 IMPLANT
SUT VIC AB 3-0 SH 27 (SUTURE) ×2
SUT VIC AB 3-0 SH 27X BRD (SUTURE) ×1 IMPLANT

## 2015-04-30 NOTE — Anesthesia Preprocedure Evaluation (Signed)
Anesthesia Evaluation  Patient identified by MRN, date of birth, ID band Patient awake    Reviewed: Allergy & Precautions, H&P , NPO status , Patient's Chart, lab work & pertinent test results  History of Anesthesia Complications Negative for: history of anesthetic complications  Airway Mallampati: III  TM Distance: >3 FB Neck ROM: full    Dental  (+) Poor Dentition   Pulmonary neg pulmonary ROS, neg shortness of breath,    Pulmonary exam normal breath sounds clear to auscultation       Cardiovascular Exercise Tolerance: Good (-) hypertensionnegative cardio ROS Normal cardiovascular exam Rhythm:regular Rate:Normal     Neuro/Psych  Headaches,    GI/Hepatic negative GI ROS,   Endo/Other    Renal/GU negative Renal ROS  negative genitourinary   Musculoskeletal   Abdominal   Peds  Hematology negative hematology ROS (+)   Anesthesia Other Findings Past Medical History:   Hx of one miscarriage                                        Frequent headaches                                          Past Surgical History:   CESAREAN SECTION                                             BMI    Body Mass Index   39.56 kg/m 2      Reproductive/Obstetrics (+) Pregnancy                             Anesthesia Physical Anesthesia Plan  ASA: III  Anesthesia Plan: Epidural   Post-op Pain Management:    Induction:   Airway Management Planned:   Additional Equipment:   Intra-op Plan:   Post-operative Plan:   Informed Consent: I have reviewed the patients History and Physical, chart, labs and discussed the procedure including the risks, benefits and alternatives for the proposed anesthesia with the patient or authorized representative who has indicated his/her understanding and acceptance.   Dental Advisory Given  Plan Discussed with: Anesthesiologist, CRNA and Surgeon  Anesthesia Plan  Comments:         Anesthesia Quick Evaluation

## 2015-04-30 NOTE — Progress Notes (Signed)
Intrapartum Progress Note  S: Patient notes feeling contractions, but still tolerable.  O: Blood pressure 120/73, pulse 92, temperature 98 F (36.7 C), temperature source Oral, resp. rate 18, height 4\' 11"  (1.499 m), weight 196 lb (88.905 kg), last menstrual period 02/12/2014. Gen App: NAD, comfortable Abdomen: soft, gravid FHT: baseline 135 bpm.  Accels present.  Decels absent. moderate in degree variability.   Tocometer: contractions q 4-5 minutes Cervix: 2/50/-3/SROM'd Extremities: Nontender, no edema.   Labs: No new labs   Assessment:  1: SIUP at 5949w1d 2. SROM 3. Latent labor 4. H/o C-section x 1 desiring TOLAC  Plan:  1. Will continue to monitor for change, Augmentation as needed with Pitocin.  2.  Patient able to mobilize while on monitor.     Hildred LaserAnika Maira Christon, MD 04/30/2015 12:53 PM

## 2015-04-30 NOTE — Progress Notes (Signed)
Intrapartum Progress Note  S: Patient starting to note increased intensity of contractions  O: Blood pressure 121/64, pulse 100, temperature 98 F (36.7 C), temperature source Oral, resp. rate 18, height 4\' 11"  (1.499 m), weight 196 lb (88.905 kg), last menstrual period 02/12/2014. Gen App: NAD, comfortable Abdomen: soft, gravid FHT: baseline 140 bpm.  Accels present.  Decels absent. moderate in degree variability.   Tocometer: contractions q 4-748minutes Cervix: 2/50/-3/SROM'd Extremities: Nontender, no edema.  Labs: No new labs  Assessment:  1: SIUP at 5551w1d 2. SROM 3. Latent labor 4. H/o C-section x 1 desiring TOLAC  Plan:  1. To initiate augmentation with Pitocin.  2.  Patient able to mobilize while on monitor. 3. Continue to monitor for s/s of chorioamnionitis     Hildred LaserAnika Temitope Flammer, MD [facility]

## 2015-04-30 NOTE — Progress Notes (Signed)
Intrapartum Progress Note  S: Called by nurse who notes continued late decelerations.  Patient was given dose of terbutaline to slow contractions and allow fetal recovery.  O: Blood pressure 109/71, pulse 87, temperature 98 F (36.7 C), temperature source Axillary, resp. rate 18, height 4\' 11"  (1.499 m), weight 196 lb (88.905 kg), last menstrual period 02/12/2014, SpO2 99 %. Gen App: NAD, comfortable with epidural Abdomen: soft, gravid FHT: baseline 145 bpm.  Accels present.  Decels present - late decelerations noted intermittently with contractions, from baseline to 90s. Moderate variability.   Tocometer: contractions q 4-8 minutes Cervix: 3/80/-2/SROM'd Extremities: Nontender, no edema.  Labs: No new labs  Assessment:  1: SIUP at 6030w1d 2. SROM at ~ 10:00 a.m.  3. Latent labor 4. H/o C-section x 1 desiring TOLAC 5. Non-reassuring fetal tracing  Plan:  1. Patient has been alternating sides, is on facemask O2, IVF bolus, and has received a dose of terbutaline with no resolution of late decelerations.  Discussion had at this time that fetal tracing is non-reassuring, remote from delivery.  Would recommend proceeding to C-section at this time.  Patient notes understanding, is ok with plan. To OR when ready.     Hildred LaserAnika Victorhugo Preis, MD [facility]

## 2015-04-30 NOTE — Progress Notes (Signed)
Intrapartum Progress Note  S: Called by nurse who notes repetitive late decelerations.  Patient is s/p epidural.   O: Blood pressure 120/89, pulse 95, temperature 98 F (36.7 C), temperature source Axillary, resp. rate 18, height 4\' 11"  (1.499 m), weight 196 lb (88.905 kg), last menstrual period 02/12/2014, SpO2 99 %. Gen App: NAD, comfortable Abdomen: soft, gravid FHT: baseline 125 bpm.  Accels present.  Decels present - 4 late decelerations noted with contractions, from baseline to 90s. Minimal to moderate variability.   Tocometer: contractions q 2-3 minutes Cervix: 3/80/-2/SROM'd Extremities: Nontender, no edema.  Labs: No new labs  Assessment:  1: SIUP at 4950w1d 2. SROM at ~ 10:00 a.m.  3. Latent labor 4. H/o C-section x 1 desiring TOLAC  Plan:  1. Pitocin not yet initiated due to patient's fetal tracing.   2. Patient turned to left side, initiated facemask O2.   3. Continue to monitor for s/s of chorioamnionitis     Hildred LaserAnika Mandy Fitzwater, MD [facility]

## 2015-04-30 NOTE — Anesthesia Procedure Notes (Signed)
Epidural Patient location during procedure: OB Start time: 04/30/2015 8:05 PM End time: 04/30/2015 8:07 PM  Staffing Anesthesiologist: Margorie JohnPISCITELLO, Torianna Junio K Performed by: anesthesiologist   Preanesthetic Checklist Completed: patient identified, site marked, surgical consent, pre-op evaluation, timeout performed, IV checked, risks and benefits discussed and monitors and equipment checked  Epidural Patient position: sitting Prep: Betadine Patient monitoring: heart rate, continuous pulse ox and blood pressure Approach: midline Location: L4-L5 Injection technique: LOR saline  Needle:  Needle type: Tuohy  Needle gauge: 17 G Needle length: 9 cm and 9 Needle insertion depth: 7 cm Catheter type: closed end flexible Catheter size: 19 Gauge Catheter at skin depth: 11 cm Test dose: negative and 1.5% lidocaine with Epi 1:200 K  Assessment Sensory level: T10 Events: blood not aspirated, injection not painful, no injection resistance, negative IV test and no paresthesia  Additional Notes   Patient tolerated the insertion well without immediate complications.Reason for block:procedure for pain

## 2015-04-30 NOTE — Progress Notes (Addendum)
Intrapartum Progress Note  S: Patient noting painful contractions, desires epidural.   O: Blood pressure 105/68, pulse 93, temperature 98 F (36.7 C), temperature source Axillary, resp. rate 17, height 4\' 11"  (1.499 m), weight 196 lb (88.905 kg), last menstrual period 02/12/2014. Gen App: moderate distress with contractions Abdomen: soft, gravid FHT: baseline 135 bpm.  Accels present.  Decels absent. moderate in degree variability.   Tocometer: contractions q 2-3 minutes Cervix: 2.5/70/-3/SROM'd Extremities: Nontender, no edema.  Labs: No new labs  Assessment:  1: SIUP at 5114w1d 2. SROM 3. Latent labor 4. H/o C-section x 1 desiring TOLAC  Plan:  1. Pitocin not yet initiated as patient's contraction pattern became more regular and more intense.   2. Increased dose of Stadol with no relief.  Had discussion with patient that epidural could slow labor progression as she is getting into a more regular pattern and may require augmentation with Pitocin which can slightly increase risk of uterine rupture. Patient notes understanding.  Can proceed with epidural. 3. Continue to monitor for s/s of chorioamnionitis     Hildred LaserAnika Namiah Dunnavant, MD [facility]

## 2015-04-30 NOTE — H&P (Signed)
Obstetric History and Physical  Mionna Advincula is a 30 y.o. G3P1011 with IUP at [redacted]w[redacted]d presenting for contractions and ruptured membranes. Patient states she has been having  regular, every 7-8 minutes contractions, none vaginal bleeding, ruptured, clear fluid membranes on elevator up to Labor and Delivery, with active fetal movement.    Prenatal Course Source of Care: [facility] with onset of care at 11 weeks Pregnancy complications or risks: Patient Active Problem List   Diagnosis Date Noted  . Labor and delivery indication for care or intervention 04/30/2015  . Excessive weight gain in pregnancy in third trimester 03/15/2015  . Edema in pregnancy in third trimester 02/28/2015  . Supervision of normal intrauterine pregnancy in multigravida 01/03/2015  . H/O cesarean section complicating pregnancy 10/12/2014  . Marijuana smoker (HCC) 09/25/2014   She plans to breastfeed She desires oral contraceptives (estrogen/progesterone) for postpartum contraception.   Prenatal labs and studies: ABO, Rh: --/--/B POS (04/17 1102) Antibody: NEG (04/17 1102) Rubella: <20.0 (09/06 0944) RPR: Non Reactive (09/06 0944)  HBsAg: Negative (09/06 0944)  HIV: Non Reactive (09/06 0944)  GBS: Negative (03/16 1428) 1 hr Glucola  Normal (86) Genetic screening normal Anatomy US normal  Past Medical History  Diagnosis Date  . Hx of one miscarriage   . Frequent headaches     Past Surgical History  Procedure Laterality Date  . Cesarean section      OB History  Gravida Para Term Preterm AB SAB TAB Ectopic Multiple Living  # Outcome Date GA Lbr Len/2nd Weight Sex Delivery Anes PTL Lv  3 Current           2 SAB 07/2014          1 Term 11/25/10 [redacted]w[redacted]d  7 lb 1 oz (3.204 kg) M CS-Unspec  N Y     Complications: Fetal Intolerance     Comments: face up, unable to turn baby      Social History   Social History  . Marital Status: Single    Spouse Name: N/A  . Number of  Children: N/A  . Years of Education: N/A   Occupational History  . student    Social History Main Topics  . Smoking status: Never Smoker   . Smokeless tobacco: Never Used  . Alcohol Use: No  . Drug Use: No  . Sexual Activity: Yes    Birth Control/ Protection: None, Pill     Comment: Pregnant    Other Topics Concern  . None   Social History Narrative    Family History  Problem Relation Age of Onset  . Hypertension Mother   . Stroke Sister   . Prostate cancer Maternal Grandfather   . Breast cancer      father's mother  . Diabetes Maternal Grandmother     Prescriptions prior to admission  Medication Sig Dispense Refill Last Dose  . Prenatal Vit-Fe Fumarate-FA (PRENATAL VITAMIN PO) Take by mouth daily.   Taking    No Known Allergies  Review of Systems: Negative except for what is mentioned in HPI.  Physical Exam: BP 120/73 mmHg  Pulse 92  Temp(Src) 98 F (36.7 C) (Oral)  Resp 18  Ht  (1.499 m)  Wt 196 lb (88.905 kg)  BMI 39.57 kg/m2  LMP 02/12/2014 (LMP Unknown) CONSTITUTIONAL: Well-developed, well-nourished female in no acute distress.  HENT:  Normocephalic, atraumatic, External right and left ear normal. Oropharynx is clear  and moist EYES: Conjunctivae and EOM are normal. Pupils are equal, round, and reactive to light. No scleral icterus.  NECK: Normal range of motion, supple, no masses SKIN: Skin is warm and dry. No rash noted. Not diaphoretic. No erythema. No pallor. NEUROLOGIC: Alert and oriented to person, place, and time. Normal reflexes, muscle tone coordination. No cranial nerve deficit noted. PSYCHIATRIC: Normal mood and affect. Normal behavior. Normal judgment and thought content. CARDIOVASCULAR: Normal heart rate noted, regular rhythm RESPIRATORY: Effort and breath sounds normal, no problems with respiration noted ABDOMEN: Soft, nontender, nondistended, gravid. MUSCULOSKELETAL: Normal range of motion. No edema and no tenderness. 2+ distal  pulses.  Cervical Exam: Dilatation 1.5 cm   Effacement 50%   Station -3  Presentation: cephalic FHT:  Baseline rate 135 bpm   Variability moderate  Accelerations present   Decelerations absent Contractions: Every 8 mins   Pertinent Labs/Studies:   Results for orders placed or performed during the hospital encounter of 04/30/15 (from the past 24 hour(s))  Type and screen [facility]     Status: None   Collection Time: 04/30/15 11:02 AM  Result Value Ref Range   ABO/RH(D) B POS    Antibody Screen NEG    Sample Expiration 05/03/2015   CBC     Status: None   Collection Time: 04/30/15 11:03 AM  Result Value Ref Range   WBC 11.0 3.6 - 11.0 K/uL   RBC 4.89 3.80 - 5.20 MIL/uL   Hemoglobin 13.5 12.0 - 16.0 g/dL   HCT 16.140.2 09.635.0 - 04.547.0 %   MCV 82.3 80.0 - 100.0 fL   MCH 27.6 26.0 - 34.0 pg   MCHC 33.5 32.0 - 36.0 g/dL   RDW 40.913.9 81.111.5 - 91.414.5 %   Platelets 230 150 - 440 K/uL  Urine Drug Screen, Qualitative ([facility] only)     Status: None   Collection Time: 04/30/15 11:03 AM  Result Value Ref Range   Tricyclic, Ur Screen NONE DETECTED NONE DETECTED   Amphetamines, Ur Screen NONE DETECTED NONE DETECTED   MDMA (Ecstasy)Ur Screen NONE DETECTED NONE DETECTED   Cocaine Metabolite,Ur [place] NONE DETECTED NONE DETECTED   Opiate, Ur Screen NONE DETECTED NONE DETECTED   Phencyclidine (PCP) Ur S NONE DETECTED NONE DETECTED   Cannabinoid 50 Ng, Ur [place] NONE DETECTED NONE DETECTED   Barbiturates, Ur Screen NONE DETECTED NONE DETECTED   Benzodiazepine, Ur Scrn NONE DETECTED NONE DETECTED   Methadone Scn, Ur NONE DETECTED NONE DETECTED  Rapid HIV screen (HIV 1/2 Ab+Ag) ([facility] Only)     Status: None   Collection Time: 04/30/15 11:03 AM  Result Value Ref Range   HIV-1 P24 Antigen - HIV24 NON REACTIVE NON REACTIVE   HIV 1/2 Antibodies NON REACTIVE NON REACTIVE   Interpretation (HIV Ag Ab)      A non reactive test result means that HIV 1 or HIV 2 antibodies and HIV 1 p24 antigen were not  detected in the specimen.    Assessment : Nicoletta Dresslexis Love is a 30 y.o. G3P1011 at 1737w1d being admitted for latent labor with SROM. Desires TOLAC (h/o prior C-section x 1).  Plan: Labor: Expectant management.  Augmentation as needed, per protocol.  FWB: Reassuring fetal heart tracing.  GBS negative Delivery plan: Hopeful for vaginal delivery   Hildred LaserAnika Gertude Benito, MD [facility]

## 2015-04-30 NOTE — OB Triage Note (Signed)
Patient states she started contraction around 0700 contractions every 7-8 minutes and then her water broke on the elevator on the way to room 0940. Denies any other problems at this time

## 2015-05-01 ENCOUNTER — Encounter: Payer: Self-pay | Admitting: *Deleted

## 2015-05-01 LAB — CBC
HEMATOCRIT: 33.3 % — AB (ref 35.0–47.0)
Hemoglobin: 10.9 g/dL — ABNORMAL LOW (ref 12.0–16.0)
MCH: 27.2 pg (ref 26.0–34.0)
MCHC: 32.6 g/dL (ref 32.0–36.0)
MCV: 83.4 fL (ref 80.0–100.0)
PLATELETS: 203 10*3/uL (ref 150–440)
RBC: 4 MIL/uL (ref 3.80–5.20)
RDW: 14.2 % (ref 11.5–14.5)
WBC: 17.2 10*3/uL — AB (ref 3.6–11.0)

## 2015-05-01 LAB — RPR: RPR: NONREACTIVE

## 2015-05-01 MED ORDER — WITCH HAZEL-GLYCERIN EX PADS: 1.0000 "application " | MEDICATED_PAD | CUTANEOUS | Status: DC | PRN

## 2015-05-01 MED ORDER — NALBUPHINE HCL 10 MG/ML IJ SOLN: 5.0000 mg | Freq: Once | INTRAMUSCULAR | Status: DC | PRN

## 2015-05-01 MED ORDER — OXYCODONE-ACETAMINOPHEN 5-325 MG PO TABS
2.0000 | ORAL_TABLET | ORAL | Status: DC | PRN
  Administered 2015-05-01 – 2015-05-02 (×7): 2 via ORAL
  Filled 2015-05-01 (×7): qty 2

## 2015-05-01 MED ORDER — PRENATAL MULTIVITAMIN CH
1.0000 | ORAL_TABLET | Freq: Every day | ORAL | Status: DC
  Administered 2015-05-01 – 2015-05-02 (×2): 1 via ORAL
  Filled 2015-05-01 (×2): qty 1

## 2015-05-01 MED ORDER — NALOXONE HCL 2 MG/2ML IJ SOSY: 1.0000 ug/kg/h | PREFILLED_SYRINGE | INTRAVENOUS | Status: DC | PRN

## 2015-05-01 MED ORDER — LIDOCAINE 5 % EX PTCH
1.0000 | MEDICATED_PATCH | CUTANEOUS | Status: DC
  Filled 2015-05-01: qty 1

## 2015-05-01 MED ORDER — ZOLPIDEM TARTRATE 5 MG PO TABS: 5.0000 mg | ORAL_TABLET | Freq: Every evening | ORAL | Status: DC | PRN

## 2015-05-01 MED ORDER — FENTANYL CITRATE (PF) 100 MCG/2ML IJ SOLN
INTRAMUSCULAR | Status: AC
  Administered 2015-05-01: 25 ug via INTRAVENOUS
  Filled 2015-05-01: qty 2

## 2015-05-01 MED ORDER — NALBUPHINE HCL 10 MG/ML IJ SOLN: 5.0000 mg | INTRAMUSCULAR | Status: DC | PRN

## 2015-05-01 MED ORDER — MIDAZOLAM HCL 2 MG/2ML IJ SOLN
INTRAMUSCULAR | Status: DC | PRN
  Administered 2015-05-01: 1 mg via INTRAVENOUS

## 2015-05-01 MED ORDER — COCONUT OIL OIL
1.0000 "application " | TOPICAL_OIL | Status: DC | PRN
  Filled 2015-05-01: qty 120

## 2015-05-01 MED ORDER — FERROUS SULFATE 325 (65 FE) MG PO TABS
325.0000 mg | ORAL_TABLET | Freq: Two times a day (BID) | ORAL | Status: DC
  Administered 2015-05-01 – 2015-05-02 (×3): 325 mg via ORAL
  Filled 2015-05-01 (×3): qty 1

## 2015-05-01 MED ORDER — SIMETHICONE 80 MG PO CHEW: 80.0000 mg | CHEWABLE_TABLET | ORAL | Status: DC | PRN

## 2015-05-01 MED ORDER — LACTATED RINGERS IV SOLN: INTRAVENOUS | Status: DC

## 2015-05-01 MED ORDER — FENTANYL CITRATE (PF) 100 MCG/2ML IJ SOLN
25.0000 ug | INTRAMUSCULAR | Status: DC | PRN
  Administered 2015-05-01: 25 ug via INTRAVENOUS
  Administered 2015-05-01: 50 ug via INTRAVENOUS
  Administered 2015-05-01 (×3): 25 ug via INTRAVENOUS
  Filled 2015-05-01: qty 2

## 2015-05-01 MED ORDER — DIBUCAINE 1 % RE OINT: 1.0000 "application " | TOPICAL_OINTMENT | RECTAL | Status: DC | PRN

## 2015-05-01 MED ORDER — SIMETHICONE 80 MG PO CHEW
80.0000 mg | CHEWABLE_TABLET | ORAL | Status: DC
  Administered 2015-05-02: 80 mg via ORAL
  Filled 2015-05-01: qty 1

## 2015-05-01 MED ORDER — IBUPROFEN 800 MG PO TABS
800.0000 mg | ORAL_TABLET | Freq: Four times a day (QID) | ORAL | Status: DC
  Administered 2015-05-01 – 2015-05-02 (×4): 800 mg via ORAL
  Filled 2015-05-01 (×4): qty 1

## 2015-05-01 MED ORDER — NALOXONE HCL 0.4 MG/ML IJ SOLN: 0.4000 mg | INTRAMUSCULAR | Status: DC | PRN

## 2015-05-01 MED ORDER — MENTHOL 3 MG MT LOZG: 1.0000 | LOZENGE | OROMUCOSAL | Status: DC | PRN

## 2015-05-01 MED ORDER — DIPHENHYDRAMINE HCL 25 MG PO CAPS: 25.0000 mg | ORAL_CAPSULE | Freq: Four times a day (QID) | ORAL | Status: DC | PRN

## 2015-05-01 MED ORDER — OXYCODONE-ACETAMINOPHEN 5-325 MG PO TABS
1.0000 | ORAL_TABLET | ORAL | Status: DC | PRN
  Administered 2015-05-01 (×2): 1 via ORAL
  Filled 2015-05-01 (×2): qty 1

## 2015-05-01 MED ORDER — ACETAMINOPHEN 325 MG PO TABS: 650.0000 mg | ORAL_TABLET | ORAL | Status: DC | PRN

## 2015-05-01 MED ORDER — LIDOCAINE 5 % EX PTCH
MEDICATED_PATCH | CUTANEOUS | Status: DC | PRN
  Administered 2015-05-01: 1 via TRANSDERMAL

## 2015-05-01 MED ORDER — KETOROLAC TROMETHAMINE 30 MG/ML IJ SOLN
30.0000 mg | Freq: Four times a day (QID) | INTRAMUSCULAR | Status: DC | PRN
  Administered 2015-05-01: 30 mg via INTRAVENOUS

## 2015-05-01 MED ORDER — ONDANSETRON HCL 4 MG/2ML IJ SOLN
INTRAMUSCULAR | Status: DC | PRN
  Administered 2015-05-01: 4 mg via INTRAVENOUS

## 2015-05-01 MED ORDER — OXYCODONE HCL 5 MG/5ML PO SOLN: 5.0000 mg | Freq: Once | ORAL | Status: DC | PRN

## 2015-05-01 MED ORDER — IBUPROFEN 800 MG PO TABS
800.0000 mg | ORAL_TABLET | Freq: Four times a day (QID) | ORAL | Status: DC
  Administered 2015-05-01: 800 mg via ORAL
  Filled 2015-05-01: qty 1

## 2015-05-01 MED ORDER — OXYTOCIN 10 UNIT/ML IJ SOLN: 2.5000 [IU]/h | INTRAMUSCULAR | Status: AC

## 2015-05-01 MED ORDER — KETOROLAC TROMETHAMINE 30 MG/ML IJ SOLN
INTRAMUSCULAR | Status: AC
  Administered 2015-05-01: 30 mg via INTRAVENOUS
  Filled 2015-05-01: qty 1

## 2015-05-01 MED ORDER — MAGNESIUM HYDROXIDE 400 MG/5ML PO SUSP: 30.0000 mL | ORAL | Status: DC | PRN

## 2015-05-01 MED ORDER — SENNOSIDES-DOCUSATE SODIUM 8.6-50 MG PO TABS
2.0000 | ORAL_TABLET | ORAL | Status: DC
  Administered 2015-05-02: 2 via ORAL
  Filled 2015-05-01: qty 2

## 2015-05-01 MED ORDER — OXYCODONE HCL 5 MG PO TABS: 5.0000 mg | ORAL_TABLET | Freq: Once | ORAL | Status: DC | PRN

## 2015-05-01 MED ORDER — SODIUM CHLORIDE 0.9% FLUSH: 3.0000 mL | INTRAVENOUS | Status: DC | PRN

## 2015-05-01 NOTE — Anesthesia Postprocedure Evaluation (Signed)
Anesthesia Post Note  Patient: Veronica Whitaker  Procedure(s) Performed: Procedure(s) (LRB): CESAREAN SECTION (N/A)  Patient location during evaluation: Nursing Unit Anesthesia Type: Epidural Level of consciousness: awake Pain management: pain level controlled Vital Signs Assessment: post-procedure vital signs reviewed and stable Respiratory status: spontaneous breathing and nonlabored ventilation Cardiovascular status: blood pressure returned to baseline Postop Assessment: no headache Anesthetic complications: no    Last Vitals:  Filed Vitals:   05/01/15 0515 05/01/15 0622  BP: 110/61 112/49  Pulse: 101 96  Temp: 36.9 C 36.9 C  Resp: 18 18    Last Pain:  Filed Vitals:   05/01/15 0623  PainSc: 3                  Johnsie Moscoso,  Claretha CooperKimberly G

## 2015-05-01 NOTE — Progress Notes (Signed)
Postpartum Day # 1: Cesarean Delivery  Subjective: Patient reports tolerating PO.  Notes burning at incision site overnight, but after removal of tape, burning improved.  Pain controlled with medications. Has not yet ambulated or self voided (still has catheter in place).   Objective: Vital signs in last 24 hours: Temp:  [97.4 F (36.3 C)-98.6 F (37 C)] 98.2 F (36.8 C) (04/18 1213) Pulse Rate:  [87-111] 104 (04/18 1213) Resp:  [17-18] 18 (04/18 1213) BP: (95-139)/(49-90) 115/69 mmHg (04/18 1213) SpO2:  [97 %-99 %] 99 % (04/18 1213)  Physical Exam:  General: alert and no distress Lungs: clear to auscultation bilaterally Breasts: normal appearance, no masses or tenderness Heart: regular rate and rhythm, S1, S2 normal, no murmur, click, rub or gallop Pelvis: Lochia appropriate, Uterine Fundus firm, Incision: dressing clean, dry, intact.  Extremities: DVT Evaluation: Negative Homan's sign.  No cords or calf tenderness. No significant calf/ankle edema.   Recent Labs  04/30/15 1103 05/01/15 0629  HGB 13.5 10.9*  HCT 40.2 33.3*    Assessment/Plan: Status post Cesarean section. Doing well postoperatively.  Breastfeeding and Contraception OCPs.   Desires circumcision for female infant, to be performed as outpatient.  Advance diet Continue PO pain management Remove foley catheter after 12 hrs post-op Discontinue IVF Continue current care.  Hildred LaserAnika Vannia Pola [facility]

## 2015-05-01 NOTE — Op Note (Signed)
Cesarean Section Procedure Note  Indications: non-reassuring fetal status  Pre-operative Diagnosis: 40 week 1 day pregnancy, repetitive late decelerations, h/o prior C-section x 1 attempting TOLAC.  Post-operative Diagnosis: same with LGA infant (fetal macrosomia)  Surgeon: Hildred LaserAnika Joeleen Wortley, MD  Assistants: None  Procedure: Repeat low transverse Cesarean Section  Anesthesia: Epidural anesthesia  ASA Class: II  Procedure Details: The patient was seen in the Holding Room. The risks, benefits, complications, treatment options, and expected outcomes were discussed with the patient.  The patient concurred with the proposed plan, giving informed consent.  The site of surgery properly noted/marked. The patient was taken to the Operating Room, identified as Veronica Whitaker and the procedure verified as C-Section Delivery. A Time Out was held and the above information confirmed.  After induction of anesthesia, the patient was draped and prepped in the usual sterile manner. Anesthesia was tested and noted to be adequate. A Pfannenstiel incision was made and carried down through the subcutaneous tissue to the fascia. Fascial incision was made and extended transversely. The fascia was separated from the underlying rectus tissue superiorly and inferiorly. The peritoneum was identified and entered. Peritoneal incision was extended longitudinally. The utero-vesical peritoneal reflection was incised transversely and the bladder flap was bluntly freed from the lower uterine segment. A low transverse uterine incision was made. Delivered from cephalic presentation was a 4150 grams Female with Apgar scores of 8 at one minute and 9 at five minutes.  Nuchal cord x 1 was reduced. After the umbilical cord was clamped and cut cord blood was obtained for evaluation. The placenta was removed intact and appeared normal. The uterus was exteriorized and cleared of all clots and debris. The uterine outline, tubes and ovaries appeared  normal.  The uterine incision was closed with running locked sutures of 0-Vicryl.  A second suture of 0-Vicryl was used in an imbricating layer.  A figure-of-eight suture was placed at the right lateral edge of the incision to achieve hemostasis.  Hemostasis was observed. Lavage was carried out until clear. The fascia was then reapproximated with a running suture of 1-0 Vicryl. The skin was reapproximated with 4-0 Monocryl.  Instrument, sponge, and needle counts were correct prior the abdominal closure and at the conclusion of the case.   Findings: Female infant, cephalic presentation, 4150 grams, with Apgar scores of 8 at one minute and 9 at five minutes. Intact placenta with 3 vessel cord.  Nuchal cord x 1, reducible.  The uterine outline, tubes and ovaries appeared normal.   Estimated Blood Loss:  500 ml      Drains: foley catheter to gravity drainage, 300 clear urine at end of the procedure         Total IV Fluids:  1000 ml  Specimens: Placenta and Disposition:  Sent to Pathology         Implants: None         Complications:  None; patient tolerated the procedure well.         Disposition: PACU - hemodynamically stable.         Condition: stable   Hildred LaserAnika Arn Mcomber, MD [facility]

## 2015-05-01 NOTE — Transfer of Care (Signed)
Immediate Anesthesia Transfer of Care Note  Patient: Veronica DressAlexis Love  Procedure(s) Performed: Procedure(s): CESAREAN SECTION (N/A)  Patient Location: PACU  Anesthesia Type:Epidural  Level of Consciousness: awake, alert , oriented and patient cooperative  Airway & Oxygen Therapy: Patient Spontanous Breathing  Post-op Assessment: Report given to RN and Post -op Vital signs reviewed and stable  Post vital signs: Reviewed and stable  Last Vitals:  Filed Vitals:   04/30/15 2216 04/30/15 2316  BP: 109/71 114/74  Pulse: 87 92  Temp: 37 C   Resp: 18     Complications: No apparent anesthesia complications

## 2015-05-02 ENCOUNTER — Encounter: Payer: Medicaid Other | Admitting: Obstetrics and Gynecology

## 2015-05-02 DIAGNOSIS — Z283 Underimmunization status: Secondary | ICD-10-CM

## 2015-05-02 DIAGNOSIS — Z2839 Other underimmunization status: Secondary | ICD-10-CM | POA: Diagnosis present

## 2015-05-02 DIAGNOSIS — O9989 Other specified diseases and conditions complicating pregnancy, childbirth and the puerperium: Secondary | ICD-10-CM

## 2015-05-02 DIAGNOSIS — O09899 Supervision of other high risk pregnancies, unspecified trimester: Secondary | ICD-10-CM | POA: Diagnosis present

## 2015-05-02 DIAGNOSIS — Z98891 History of uterine scar from previous surgery: Secondary | ICD-10-CM

## 2015-05-02 MED ORDER — DOCUSATE SODIUM 100 MG PO CAPS: 100.0000 mg | ORAL_CAPSULE | Freq: Two times a day (BID) | ORAL | Status: DC | PRN

## 2015-05-02 MED ORDER — IBUPROFEN 800 MG PO TABS: 800.0000 mg | ORAL_TABLET | Freq: Three times a day (TID) | ORAL | Status: DC | PRN

## 2015-05-02 MED ORDER — OXYCODONE-ACETAMINOPHEN 5-325 MG PO TABS: 1.0000 | ORAL_TABLET | Freq: Four times a day (QID) | ORAL | Status: DC | PRN

## 2015-05-02 MED ORDER — MEASLES, MUMPS & RUBELLA VAC ~~LOC~~ INJ
0.5000 mL | INJECTION | Freq: Once | SUBCUTANEOUS | Status: DC
  Filled 2015-05-02: qty 0.5

## 2015-05-02 MED ORDER — VARICELLA VIRUS VACCINE LIVE 1350 PFU/0.5ML IJ SUSR
0.5000 mL | Freq: Once | INTRAMUSCULAR | Status: AC
  Administered 2015-05-02: 0.5 mL via SUBCUTANEOUS
  Filled 2015-05-02 (×2): qty 0.5

## 2015-05-02 NOTE — Progress Notes (Signed)
Rx given for home use.  Pt verbalized u/o of home discharge instructions.

## 2015-05-02 NOTE — Progress Notes (Signed)
Postpartum Day # 2: Cesarean Delivery  Subjective: Patient reports tolerating PO, + flatus and no problems voiding.    Objective: Blood pressure 94/62, pulse 80, temperature 98 F (36.7 C), temperature source Oral, resp. rate 18, height _0  (1.499 m), weight 196 lb (88.905 kg), last menstrual period 02/12/2014, SpO2 100 %, currently breastfeeding.   Physical Exam:  General: alert and no distress Lungs: clear to auscultation bilaterally Breasts: normal appearance, no masses or tenderness Heart: regular rate and rhythm, S1, S2 normal, no murmur, click, rub or gallop Pelvis: Lochia appropriate, Uterine Fundus firm, Incision: healing well, clean, dry, intact.  Extremities: DVT Evaluation: Negative Homan's sign.  No cords or calf tenderness. No significant calf/ankle edema.   Recent Labs  04/30/15 1103 05/01/15 0629  HGB 13.5 10.9*  HCT 40.2 33.3*    Assessment/Plan: Status post Cesarean section. Doing well postoperatively.  Bottle and Breastfeeding and Contraception OCPs.   Desires circumcision for female infant, to be performed as outpatient.  Encourage ambulation Regular diet Continue current care. Can discharge home today.  For MMR and Varivax vaccinations prior to discharge as patient is non-immune.    Rubie Maid [facility]

## 2015-05-02 NOTE — Progress Notes (Signed)
Discharged to home with newborn.  To car via Insurance underwriterauxillary staff

## 2015-05-02 NOTE — Discharge Summary (Signed)
Obstetric Discharge Summary Reason for Admission: onset of labor and rupture of membranes Prenatal Procedures: ultrasound Intrapartum Procedures: cesarean: low cervical, transverse Postpartum Procedures: Rubella Ig and Varivax Complications-Operative and Postpartum: none HEMOGLOBIN  Date Value Ref Range Status  05/01/2015 10.9* 12.0 - 16.0 g/dL Final   HCT  Date Value Ref Range Status  05/01/2015 33.3* 35.0 - 47.0 % Final   HEMATOCRIT  Date Value Ref Range Status  01/31/2015 35.1 34.0 - 46.6 % Final    Physical Exam:  General: alert, cooperative and no distress Lochia: appropriate Uterine Fundus: firm Incision: healing well, no significant drainage, no dehiscence, no significant erythema DVT Evaluation: Negative Homan's sign. No cords or calf tenderness. No significant calf/ankle edema.  Discharge Diagnoses: Term Pregnancy-delivered and PROM x14 hours, h/o prior C-section x 1  Discharge Information: Date: 05/02/2015 Activity: pelvic rest Diet: routine Medications: PNV, Ibuprofen, Colace and Percocet Condition: stable Instructions: refer to practice specific booklet Discharge to: home Follow-up Information    Follow up with Veronica Veronica Thomasene Dubow, MD. Go on 05/09/2015.   Specialties:  Obstetrics and Gynecology, Radiology   Why:  For wound re-check at 11:30am with Dr.[name]   Contact information:   [address] [place] [id number] [phone]       Newborn Data: Live born female  Birth Weight: 9 lb 2.4 oz (4150 g) APGAR: 8, 9  Home with mother.  Veronica Veronica Whitaker 05/02/2015, 10:20 AM

## 2015-05-03 LAB — SURGICAL PATHOLOGY

## 2015-05-09 ENCOUNTER — Ambulatory Visit (INDEPENDENT_AMBULATORY_CARE_PROVIDER_SITE_OTHER): Payer: Medicaid Other | Admitting: Obstetrics and Gynecology

## 2015-05-09 ENCOUNTER — Encounter: Payer: Self-pay | Admitting: Obstetrics and Gynecology

## 2015-05-09 VITALS — BP 118/75 | HR 75 | Ht 59.0 in | Wt 179.1 lb

## 2015-05-09 DIAGNOSIS — Z98891 History of uterine scar from previous surgery: Secondary | ICD-10-CM

## 2015-05-09 DIAGNOSIS — Z9889 Other specified postprocedural states: Secondary | ICD-10-CM

## 2015-05-09 DIAGNOSIS — Z5189 Encounter for other specified aftercare: Secondary | ICD-10-CM

## 2015-05-11 NOTE — Progress Notes (Signed)
    OBSTETRIC POST-OPERATIVE CLINIC VISIT  Subjective:     Veronica Whitaker is a 30 y.o. [phone] female who presents to the clinic 1 weeks status post repeat C-section for h/o prior C-section x 1 and failed TOLAC.  Eating a regular diet without difficulty. Bowel movements are normal. Pain is controlled with current analgesics. Medications being used: prescription NSAID's including ibuprofen (Motrin).  The following portions of the patient's history were reviewed and updated as appropriate: allergies, current medications, past family history, past medical history, past social history, past surgical history and problem list.  Review of Systems Pertinent items noted in HPI and remainder of comprehensive ROS otherwise negative.    Objective:    BP 118/75 mmHg  Pulse 75  Ht 4\' 11"  (1.499 m)  Wt 179 lb 1.6 oz (81.239 kg)  BMI 36.15 kg/m2  LMP 02/12/2014 (LMP Unknown)  Breastfeeding? Yes General:  alert and no distress  Abdomen: soft, bowel sounds active, non-tender  Incision:   healing well, no drainage, no erythema, no hernia, no seroma, no swelling, no dehiscence, incision well approximated     Assessment:    Doing well postoperatively.  S/p RLTCS.    Plan:    1. Continue any current medications. 2. Wound care discussed. 3. Activity restrictions: no excessive bending, stooping, or squatting and no lifting more than 15 pounds 4. Anticipated return to work: 5 weeks. 5. Follow up: 5 weeks for postpartum checkl.     Hildred LaserAnika Monda Chastain, MD [facility]

## 2015-06-13 ENCOUNTER — Encounter: Payer: Self-pay | Admitting: Obstetrics and Gynecology

## 2015-06-13 ENCOUNTER — Ambulatory Visit (INDEPENDENT_AMBULATORY_CARE_PROVIDER_SITE_OTHER): Payer: Medicaid Other | Admitting: Obstetrics and Gynecology

## 2015-06-13 DIAGNOSIS — E669 Obesity, unspecified: Secondary | ICD-10-CM

## 2015-06-13 MED ORDER — ETONOGESTREL-ETHINYL ESTRADIOL 0.12-0.015 MG/24HR VA RING: VAGINAL_RING | VAGINAL | Status: DC

## 2015-06-13 NOTE — Progress Notes (Signed)
   OBSTETRICS POSTPARTUM CLINIC PROGRESS NOTE  Subjective:     Veronica Whitaker is a 30 y.o. [id number] female who presents for a postpartum visit. She is 6 weeks postpartum following a repeat low cervical transverse Cesarean section. I have fully reviewed the prenatal and intrapartum course. The delivery was at 39 gestational weeks.  Anesthesia: spinal. Postpartum course has been well. Baby's course has been well. Baby is feeding by both breast and bottle - Similac Advance. Bleeding: patient has not resumed menses, with No LMP recorded.. Bowel function is normal. Bladder function is normal. Patient is not sexually active. Contraception method desired is Nexplanon. Postpartum depression screening: negative (PHQ-2 score is 0).  The following portions of the patient's history were reviewed and updated as appropriate: allergies, current medications, past family history, past medical history, past social history, past surgical history and problem list.  Review of Systems Pertinent items noted in HPI and remainder of comprehensive ROS otherwise negative.   Objective:    BP 107/63 mmHg  Pulse 66  Ht 4\' 11"  (1.499 m)  Wt 171 lb (77.565 kg)  BMI 34.52 kg/m2  Breastfeeding? Yes  General:  alert and no distress   Breasts:  inspection negative, no nipple discharge or bleeding, no masses or nodularity palpable  Lungs: clear to auscultation bilaterally  Heart:  regular rate and rhythm, S1, S2 normal, no murmur, click, rub or gallop  Abdomen: soft, non-tender; bowel sounds normal; no masses,  no organomegaly.  Well healed Pfannenstiel incision   Vulva:  normal  Vagina: normal vagina, no discharge, exudate, lesion, or erythema  Cervix:  no cervical motion tenderness and no lesions  Corpus: normal size, contour, position, consistency, mobility, non-tender  Adnexa:  normal adnexa and no mass, fullness, tenderness  Rectal Exam: Not performed.         Labs:  Lab Results  Component Value Date   HGB 10.9*  05/01/2015     Assessment:   Routine postpartum exam.   S/p cesarean section Obesity    Plan:   1. Contraception: Nexplanon desired.  To f/u in 2 weeks for placemetn.  2. Patient concerned about total weight gain in pregnancy (~ 60 lbs).  Discussed lifestyle modifications (diet and exercise). Can refer to Lifestyles if desired.   3. Follow up in: 3-6 months with [facility] for annual well woman exam,  or as needed.    Hildred LaserAnika Branndon Tuite, MD [facility]

## 2017-02-10 ENCOUNTER — Other Ambulatory Visit: Payer: Self-pay

## 2017-02-10 ENCOUNTER — Encounter: Payer: Self-pay | Admitting: Family Medicine

## 2017-02-10 ENCOUNTER — Ambulatory Visit (INDEPENDENT_AMBULATORY_CARE_PROVIDER_SITE_OTHER): Payer: BLUE CROSS/BLUE SHIELD | Admitting: Family Medicine

## 2017-02-10 VITALS — BP 112/77 | HR 77 | Resp 16 | Ht 59.0 in | Wt 160.0 lb

## 2017-02-10 DIAGNOSIS — Z23 Encounter for immunization: Secondary | ICD-10-CM

## 2017-02-10 DIAGNOSIS — Z7689 Persons encountering health services in other specified circumstances: Secondary | ICD-10-CM

## 2017-02-10 NOTE — Progress Notes (Signed)
Name: Veronica Whitaker   MRN: [id number]    DOB: 03-03-1985   Date:02/10/2017       Progress Note  Subjective  Chief Complaint  Chief Complaint  Patient presents with  . Establish Care    Patient presents for establishment of care.    No problem-specific Assessment & Plan notes found for this encounter.   Past Medical History:  Diagnosis Date  . Frequent headaches   . Hx of one miscarriage     Past Surgical History:  Procedure Laterality Date  . CESAREAN SECTION    . CESAREAN SECTION N/A 04/30/2015   Procedure: CESAREAN SECTION;  Surgeon: Hildred LaserAnika Cherry, MD;  Location: [facility] ORS;  Service: Obstetrics;  Laterality: N/A;    Family History  Problem Relation Age of Onset  . Hypertension Mother   . Stroke Sister   . Prostate cancer Maternal Grandfather   . Breast cancer Unknown        father's mother  . Diabetes Maternal Grandmother     Social History   Socioeconomic History  . Marital status: Single    Spouse name: Not on file  . Number of children: Not on file  . Years of education: Not on file  . Highest education level: Not on file  Social Needs  . Financial resource strain: Not on file  . Food insecurity - worry: Not on file  . Food insecurity - inability: Not on file  . Transportation needs - medical: Not on file  . Transportation needs - non-medical: Not on file  Occupational History  . Occupation: Consulting civil engineerstudent  Tobacco Use  . Smoking status: Former Smoker    Last attempt to quit: 01/13/2009    Years since quitting: 8.0  . Smokeless tobacco: Never Used  Substance and Sexual Activity  . Alcohol use: No  . Drug use: No  . Sexual activity: Yes    Birth control/protection: Inserts  Other Topics Concern  . Not on file  Social History Narrative  . Not on file    No Known Allergies  Outpatient Medications Prior to Visit  Medication Sig Dispense Refill  . etonogestrel-ethinyl estradiol (NUVARING) 0.12-0.015 MG/24HR vaginal ring Insert vaginally and leave in  place for 3 consecutive weeks, then remove for 1 week. 1 each 12  . gentamicin (GARAMYCIN) 0.3 % ophthalmic solution   0  . Prenatal Vit-Fe Fumarate-FA (PRENATAL VITAMIN PO) Take by mouth daily.     No facility-administered medications prior to visit.     Review of Systems  Constitutional: Negative for chills, diaphoresis, fever, malaise/fatigue and weight loss.  HENT: Negative for congestion, ear discharge, ear pain, hearing loss, nosebleeds, sinus pain, sore throat and tinnitus.   Eyes: Negative for blurred vision, double vision, photophobia, pain, discharge and redness.  Respiratory: Negative for cough, sputum production, shortness of breath, wheezing and stridor.   Cardiovascular: Negative for chest pain, palpitations, orthopnea, claudication, leg swelling and PND.  Gastrointestinal: Negative for abdominal pain, blood in stool, constipation, diarrhea, heartburn, melena, nausea and vomiting.  Genitourinary: Negative for dysuria, frequency, hematuria and urgency.  Musculoskeletal: Negative for back pain, joint pain, myalgias and neck pain.  Skin: Negative for itching and rash.  Neurological: Negative for dizziness, tingling, tremors, sensory change, speech change, focal weakness, seizures, loss of consciousness, weakness and headaches.  Endo/Heme/Allergies: Negative for environmental allergies and polydipsia. Does not bruise/bleed easily.  Psychiatric/Behavioral: Negative for depression and suicidal ideas. The patient is not nervous/anxious and does not have insomnia.      Objective  Vitals:   02/10/17 1455  BP: 112/77  Pulse: 77  Resp: 16  SpO2: 100%  Weight: 160 lb (72.6 kg)  Height: 4\' 11"  (1.499 m)    Physical Exam  Constitutional: She is well-developed, well-nourished, and in no distress. No distress.  HENT:  Head: Normocephalic and atraumatic.  Right Ear: External ear normal.  Left Ear: External ear normal.  Nose: Nose normal.  Mouth/Throat: Oropharynx is clear and  moist.  Eyes: Conjunctivae and EOM are normal. Pupils are equal, round, and reactive to light. Right eye exhibits no discharge. Left eye exhibits no discharge.  Neck: Normal range of motion. Neck supple. No JVD present. No thyromegaly present.  Cardiovascular: Normal rate, regular rhythm, normal heart sounds and intact distal pulses. Exam reveals no gallop and no friction rub.  No murmur heard. Pulmonary/Chest: Effort normal and breath sounds normal. She has no wheezes. She has no rales.  Abdominal: Soft. Bowel sounds are normal. She exhibits no mass. There is no tenderness. There is no guarding.  Musculoskeletal: Normal range of motion. She exhibits no edema.  Lymphadenopathy:    She has no cervical adenopathy.  Neurological: She is alert. She has normal reflexes.  Skin: Skin is warm and dry. She is not diaphoretic.  Psychiatric: Mood and affect normal.  Nursing note and vitals reviewed.     Assessment & Plan  Problem List Items Addressed This Visit    None    Visit Diagnoses    Need for influenza vaccination    -  Primary   Relevant Orders   Flu Vaccine QUAD 6+ mos PF IM (Fluarix Quad PF)   Establishing care with new doctor, encounter for          No orders of the defined types were placed in this encounter.     Dr. Hayden [facility]  02/10/17

## 2017-02-24 ENCOUNTER — Encounter: Payer: Self-pay | Admitting: Family Medicine

## 2017-02-24 ENCOUNTER — Ambulatory Visit (INDEPENDENT_AMBULATORY_CARE_PROVIDER_SITE_OTHER): Payer: BLUE CROSS/BLUE SHIELD | Admitting: Family Medicine

## 2017-02-24 VITALS — BP 100/62 | HR 88 | Temp 98.2°F | Ht 59.0 in | Wt 163.0 lb

## 2017-02-24 DIAGNOSIS — J069 Acute upper respiratory infection, unspecified: Secondary | ICD-10-CM | POA: Diagnosis not present

## 2017-02-24 DIAGNOSIS — N912 Amenorrhea, unspecified: Secondary | ICD-10-CM

## 2017-02-24 MED ORDER — GUAIFENESIN-CODEINE 100-10 MG/5ML PO SYRP: 5.0000 mL | ORAL_SOLUTION | Freq: Three times a day (TID) | ORAL | 0 refills | Status: DC | PRN

## 2017-02-24 NOTE — Patient Instructions (Signed)
Upper Respiratory Infection, Adult Most upper respiratory infections (URIs) are caused by a virus. A URI affects the nose, throat, and upper air passages. The most common type of URI is often called "the common cold." Follow these instructions at home:  Take medicines only as told by your doctor.  Gargle warm saltwater or take cough drops to comfort your throat as told by your doctor.  Use a warm mist humidifier or inhale steam from a shower to increase air moisture. This may make it easier to breathe.  Drink enough fluid to keep your pee (urine) clear or pale yellow.  Eat soups and other clear broths.  Have a healthy diet.  Rest as needed.  Go back to work when your fever is gone or your doctor says it is okay. ? You may need to stay home longer to avoid giving your URI to others. ? You can also wear a face mask and wash your hands often to prevent spread of the virus.  Use your inhaler more if you have asthma.  Do not use any tobacco products, including cigarettes, chewing tobacco, or electronic cigarettes. If you need help quitting, ask your doctor. Contact a doctor if:  You are getting worse, not better.  Your symptoms are not helped by medicine.  You have chills.  You are getting more short of breath.  You have brown or red mucus.  You have yellow or brown discharge from your nose.  You have pain in your face, especially when you bend forward.  You have a fever.  You have puffy (swollen) neck glands.  You have pain while swallowing.  You have white areas in the back of your throat. Get help right away if:  You have very bad or constant: ? Headache. ? Ear pain. ? Pain in your forehead, behind your eyes, and over your cheekbones (sinus pain). ? Chest pain.  You have long-lasting (chronic) lung disease and any of the following: ? Wheezing. ? Long-lasting cough. ? Coughing up blood. ? A change in your usual mucus.  You have a stiff neck.  You have  changes in your: ? Vision. ? Hearing. ? Thinking. ? Mood. This information is not intended to replace advice given to you by your health care provider. Make sure you discuss any questions you have with your health care provider. Document Released: 06/18/2007 Document Revised: 09/02/2015 Document Reviewed: 04/06/2013 Elsevier Interactive Patient Education  2018 Elsevier Inc[employer]  

## 2017-02-24 NOTE — Progress Notes (Signed)
Name: Veronica Whitaker   MRN: [id number]    DOB: June 19, 1985   Date:02/24/2017       Progress Note  Subjective  Chief Complaint  Chief Complaint  Patient presents with  . Sinusitis    cough and cong- aching in back and neck. low grade fever 99.0    Sinusitis  This is a new problem. The current episode started in the past 7 days (sunday). The problem has been gradually improving since onset. Maximum temperature: 99.5. The fever has been present for 1 to 2 days. She is experiencing no pain. Associated symptoms include coughing, headaches, a hoarse voice, shortness of breath and a sore throat. Pertinent negatives include no chills, congestion, diaphoresis, ear pain, neck pain, sinus pressure, sneezing or swollen glands. Past treatments include acetaminophen and oral decongestants.  Cough  This is a new problem. The current episode started in the past 7 days. The problem has been gradually worsening. The cough is non-productive. Associated symptoms include a fever, headaches, myalgias, postnasal drip, a sore throat and shortness of breath. Pertinent negatives include no chest pain, chills, ear congestion, ear pain, heartburn, hemoptysis, nasal congestion, rash, rhinorrhea, sweats, weight loss or wheezing. There is no history of environmental allergies.    No problem-specific Assessment & Plan notes found for this encounter.   Past Medical History:  Diagnosis Date  . Frequent headaches   . Hx of one miscarriage     Past Surgical History:  Procedure Laterality Date  . CESAREAN SECTION    . CESAREAN SECTION N/A 04/30/2015   Procedure: CESAREAN SECTION;  Surgeon: Hildred LaserAnika Cherry, MD;  Location: [facility] ORS;  Service: Obstetrics;  Laterality: N/A;    Family History  Problem Relation Age of Onset  . Hypertension Mother   . Stroke Sister   . Prostate cancer Maternal Grandfather   . Breast cancer Unknown        father's mother  . Diabetes Maternal Grandmother     Social History   Socioeconomic  History  . Marital status: Single    Spouse name: Not on file  . Number of children: Not on file  . Years of education: Not on file  . Highest education level: Not on file  Social Needs  . Financial resource strain: Not on file  . Food insecurity - worry: Not on file  . Food insecurity - inability: Not on file  . Transportation needs - medical: Not on file  . Transportation needs - non-medical: Not on file  Occupational History  . Occupation: Consulting civil engineerstudent  Tobacco Use  . Smoking status: Former Smoker    Last attempt to quit: 01/13/2009    Years since quitting: 8.1  . Smokeless tobacco: Never Used  Substance and Sexual Activity  . Alcohol use: No  . Drug use: No  . Sexual activity: Yes    Birth control/protection: Inserts  Other Topics Concern  . Not on file  Social History Narrative  . Not on file    No Known Allergies  No outpatient medications prior to visit.   No facility-administered medications prior to visit.     Review of Systems  Constitutional: Positive for fever. Negative for chills, diaphoresis, malaise/fatigue and weight loss.  HENT: Positive for hoarse voice, postnasal drip and sore throat. Negative for congestion, ear discharge, ear pain, rhinorrhea, sinus pressure and sneezing.   Eyes: Negative for blurred vision.  Respiratory: Positive for cough and shortness of breath. Negative for hemoptysis, sputum production and wheezing.   Cardiovascular: Negative for  chest pain, palpitations and leg swelling.  Gastrointestinal: Negative for abdominal pain, blood in stool, constipation, diarrhea, heartburn, melena and nausea.  Genitourinary: Negative for dysuria, frequency, hematuria and urgency.  Musculoskeletal: Positive for myalgias. Negative for back pain, joint pain and neck pain.  Skin: Negative for rash.  Neurological: Positive for headaches. Negative for dizziness, tingling, sensory change and focal weakness.  Endo/Heme/Allergies: Negative for environmental  allergies and polydipsia. Does not bruise/bleed easily.  Psychiatric/Behavioral: Negative for depression and suicidal ideas. The patient is not nervous/anxious and does not have insomnia.      Objective  Vitals:   02/24/17 0931  BP: 100/62  Pulse: 88  Temp: 98.2 F (36.8 C)  TempSrc: Oral  Weight: 163 lb (73.9 kg)  Height: 4\' 11"  (1.499 m)    Physical Exam  Constitutional: She is well-developed, well-nourished, and in no distress. No distress.  HENT:  Head: Normocephalic and atraumatic.  Right Ear: External ear normal.  Left Ear: External ear normal.  Nose: Nose normal.  Mouth/Throat: Oropharynx is clear and moist.  Eyes: Conjunctivae and EOM are normal. Pupils are equal, round, and reactive to light. Right eye exhibits no discharge. Left eye exhibits no discharge.  Neck: Normal range of motion. Neck supple. No JVD present. No thyromegaly present.  Cardiovascular: Normal rate, regular rhythm, normal heart sounds and intact distal pulses. Exam reveals no gallop and no friction rub.  No murmur heard. Pulmonary/Chest: Effort normal and breath sounds normal. She has no wheezes. She has no rales.  Abdominal: Soft. Bowel sounds are normal. She exhibits no mass. There is no tenderness. There is no guarding.  Musculoskeletal: Normal range of motion. She exhibits no edema.  Lymphadenopathy:    She has no cervical adenopathy.  Neurological: She is alert. She has normal reflexes.  Skin: Skin is warm and dry. She is not diaphoretic.  Psychiatric: Mood and affect normal.  Nursing note and vitals reviewed.     Assessment & Plan  Problem List Items Addressed This Visit    None    Visit Diagnoses    Viral upper respiratory tract infection    -  Primary   fluids/tylenol/ decongestants   Relevant Medications   guaiFENesin-codeine (ROBITUSSIN AC) 100-10 MG/5ML syrup   Amenorrhea       pt to see Dr Valentino Saxon      Meds ordered this encounter  Medications  . guaiFENesin-codeine  (ROBITUSSIN AC) 100-10 MG/5ML syrup    Sig: Take 5 mLs by mouth 3 (three) times daily as needed for cough.    Dispense:  100 mL    Refill:  0      Dr. Elizabeth Sauer [facility]  02/24/17

## 2017-03-04 ENCOUNTER — Encounter: Payer: BLUE CROSS/BLUE SHIELD | Admitting: Obstetrics and Gynecology

## 2017-08-12 ENCOUNTER — Emergency Department
Admission: EM | Admit: 2017-08-12 | Discharge: 2017-08-12 | Disposition: A | Discharge status: Home / Self Care | Payer: BLUE CROSS/BLUE SHIELD | Attending: Emergency Medicine | Admitting: Emergency Medicine

## 2017-08-12 ENCOUNTER — Emergency Department: Payer: BLUE CROSS/BLUE SHIELD

## 2017-08-12 ENCOUNTER — Other Ambulatory Visit: Payer: Self-pay

## 2017-08-12 ENCOUNTER — Encounter: Payer: Self-pay | Admitting: Emergency Medicine

## 2017-08-12 DIAGNOSIS — Z87891 Personal history of nicotine dependence: Secondary | ICD-10-CM | POA: Insufficient documentation

## 2017-08-12 DIAGNOSIS — R0789 Other chest pain: Secondary | ICD-10-CM

## 2017-08-12 DIAGNOSIS — R079 Chest pain, unspecified: Secondary | ICD-10-CM | POA: Diagnosis not present

## 2017-08-12 LAB — BASIC METABOLIC PANEL
Anion gap: 7 (ref 5–15)
BUN: 8 mg/dL (ref 6–20)
CO2: 25 mmol/L (ref 22–32)
CREATININE: 0.63 mg/dL (ref 0.44–1.00)
Calcium: 8.9 mg/dL (ref 8.9–10.3)
Chloride: 107 mmol/L (ref 98–111)
GFR calc Af Amer: >60 mL/min (ref >60)
GLUCOSE: 107 mg/dL — AB (ref 70–99)
Potassium: 4.3 mmol/L (ref 3.5–5.1)
SODIUM: 139 mmol/L (ref 135–145)

## 2017-08-12 LAB — CBC
HCT: 38.4 % (ref 35.0–47.0)
Hemoglobin: 12.5 g/dL (ref 12.0–16.0)
MCH: 26.7 pg (ref 26.0–34.0)
MCHC: 32.5 g/dL (ref 32.0–36.0)
MCV: 82.2 fL (ref 80.0–100.0)
PLATELETS: 263 10*3/uL (ref 150–440)
RBC: 4.68 MIL/uL (ref 3.80–5.20)
RDW: 14.6 % — AB (ref 11.5–14.5)
WBC: 7.3 10*3/uL (ref 3.6–11.0)

## 2017-08-12 LAB — TROPONIN I: Troponin I: <0.03 ng/mL (ref <0.03)

## 2017-08-12 LAB — POCT PREGNANCY, URINE: PREG TEST UR: NEGATIVE

## 2017-08-12 NOTE — ED Notes (Signed)
Pt stating that she has had on and off CP. Pt stating that she is "just working" when it starts. Pt denying n/v or lightheadedness. Pt stating tightness and SOB. Pt denying pain at this time.

## 2017-08-12 NOTE — ED Triage Notes (Signed)
Pt c/o chest tightness while at work around 8am, states it has eased off now.. Pt is in NAD..Marland Kitchen

## 2017-08-12 NOTE — ED Provider Notes (Signed)
[facility] Emergency Department Provider Note  ____________________________________________  Time seen: Approximately 11:35 AM  I have reviewed the triage vital signs and the nursing notes.   HISTORY  Chief Complaint Chest Pain    HPI Veronica Whitaker is a 32 y.o. female with no significant past medical history who complains of an episode of chest pain that started at 830 this morning.  Described as tightness, constant, lasted for 45 minutes and then resolve spontaneously.  No recurrence and she is been symptom-free since 915.  It was associated with shortness of breath.  Better with walking around and taking deep breaths.  Not exertional, not pleuritic no other significant aggravating factors.  No associated radiation diaphoresis vomiting or dizziness.  She had a similar episode 1 week ago.  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   Husband notes the patient has been very stressed recently, very busy with work and taking kids to football practice, had a poor diet over the past week.   Past Medical History:  Diagnosis Date  . Frequent headaches   . Hx of one miscarriage      Patient Active Problem List   Diagnosis Date Noted  . S/P cesarean section 05/02/2015  . Marijuana smoker (HCC) 09/25/2014     Past Surgical History:  Procedure Laterality Date  . CESAREAN SECTION    . CESAREAN SECTION N/A 04/30/2015   Procedure: CESAREAN SECTION;  Surgeon: Hildred LaserAnika Cherry, MD;  Location: [facility] ORS;  Service: Obstetrics;  Laterality: N/A;     Prior to Admission medications   Medication Sig Start Date End Date Taking? Authorizing Provider  guaiFENesin-codeine (ROBITUSSIN AC) 100-10 MG/5ML syrup Take 5 mLs by mouth 3 (three) times daily as needed for cough. 02/24/17   Duanne LimerickJones, Deanna C, MD     Allergies Patient has no known allergies.   Family History  Problem Relation Age of Onset  .  Hypertension Mother   . Stroke Sister   . Prostate cancer Maternal Grandfather   . Breast cancer Unknown        father's mother  . Diabetes Maternal Grandmother   Negative for thyroid disease  Social History Social History   Tobacco Use  . Smoking status: Former Smoker    Last attempt to quit: 01/13/2009    Years since quitting: 8.5  . Smokeless tobacco: Never Used  Substance Use Topics  . Alcohol use: No  . Drug use: No  Occasional alcohol use.  Occasional marijuana use  Review of Systems  Constitutional:   No fever or chills.  ENT:   No sore throat. No rhinorrhea.  No morning cough Cardiovascular: Positive as above chest tightness without syncope. Respiratory:   No dyspnea or cough. Gastrointestinal:   Negative for abdominal pain, vomiting and diarrhea.  Musculoskeletal:   Negative for focal pain or swelling All other systems reviewed and are negative except as documented above in ROS and HPI.  ____________________________________________   PHYSICAL EXAM:  VITAL SIGNS: ED Triage Vitals  Enc Vitals Group     BP 08/12/17 1013 112/72     Pulse Rate 08/12/17 1013 80     Resp 08/12/17 1013 18     Temp 08/12/17 1013 98.4 F (36.9 C)     Temp Source 08/12/17 1013 Oral     SpO2 08/12/17 1013 100 %     Weight 08/12/17 1013 170 lb (77.1 kg)  Height 08/12/17 1013 4\' 11"  (1.499 m)     Head Circumference --      Peak Flow --      Pain Score 08/12/17 1022 0     Pain Loc --      Pain Edu? --      Excl. in GC? --     Vital signs reviewed, nursing assessments reviewed.   Constitutional:   Alert and oriented. Non-toxic appearance. Eyes:   Conjunctivae are normal. EOMI. PERRL. ENT      Head:   Normocephalic and atraumatic.      Nose:   No congestion/rhinnorhea.       Mouth/Throat:   MMM, no pharyngeal erythema. No peritonsillar mass.       Neck:   No meningismus. Full ROM.  Thyroid nonpalpable Hematological/Lymphatic/Immunilogical:   No cervical  lymphadenopathy. Cardiovascular:   RRR. Symmetric bilateral radial and DP pulses.  No murmurs. Cap refill less than 2 seconds. Respiratory:   Normal respiratory effort without tachypnea/retractions. Breath sounds are clear and equal bilaterally. No wheezes/rales/rhonchi. Gastrointestinal:   Soft and nontender. Non distended. There is no CVA tenderness.  No rebound, rigidity, or guarding. Musculoskeletal:   Normal range of motion in all extremities. No joint effusions.  No lower extremity tenderness.  No edema. Neurologic:   Normal speech and language.  Motor grossly intact. No acute focal neurologic deficits are appreciated.  Skin:    Skin is warm, dry and intact. No rash noted.  No petechiae, purpura, or bullae.  ____________________________________________    LABS (pertinent positives/negatives) (all labs ordered are listed, but only abnormal results are displayed) Labs Reviewed  BASIC METABOLIC PANEL - Abnormal; Notable for the following components:      Result Value   Glucose, Bld 107 (*)    All other components within normal limits  CBC - Abnormal; Notable for the following components:   RDW 14.6 (*)    All other components within normal limits  TROPONIN I  POC URINE PREG, ED  POCT PREGNANCY, URINE   ____________________________________________   EKG  Interpreted by me  Date: 08/12/2017  Rate: 77  Rhythm: normal sinus rhythm  QRS Axis: normal  Intervals: normal  ST/T Wave abnormalities: normal  Conduction Disutrbances: none  Narrative Interpretation: unremarkable      ____________________________________________    RADIOLOGY  Dg Chest 2 View  Result Date: 08/12/2017 CLINICAL DATA:  Acute chest pain today.  Initial encounter. EXAM: CHEST - 2 VIEW COMPARISON:  None. FINDINGS: The cardiomediastinal silhouette is unremarkable. There is no evidence of focal airspace disease, pulmonary edema, suspicious pulmonary nodule/mass, pleural effusion, or pneumothorax. No  acute bony abnormalities are identified. IMPRESSION: No active cardiopulmonary disease. Electronically Signed   By: Harmon Pier M.D.   On: 08/12/2017 10:54    ____________________________________________   PROCEDURES Procedures  ____________________________________________    CLINICAL IMPRESSION / ASSESSMENT AND PLAN / ED COURSE  Pertinent labs & imaging results that were available during my care of the patient were reviewed by me and considered in my medical decision making (see chart for details).    Patient presents with atypical chest pain.Considering the patient's symptoms, medical history, and physical examination today, I have low suspicion for ACS, PE, TAD, pneumothorax, carditis, mediastinitis, pneumonia, CHF, or sepsis.  Possibly stress or acid reflux related, but unclear at this time.  Recommended follow-up with primary care.      ____________________________________________   FINAL CLINICAL IMPRESSION(S) / ED DIAGNOSES    Final diagnoses:  Atypical chest  pain     ED Discharge Orders    None      Portions of this note were generated with dragon dictation software. Dictation errors may occur despite best attempts at proofreading.    Sharman Cheek, MD 08/12/17 1139

## 2018-06-28 ENCOUNTER — Encounter: Payer: Self-pay | Admitting: Obstetrics and Gynecology

## 2018-06-28 ENCOUNTER — Other Ambulatory Visit: Payer: Self-pay

## 2018-06-28 ENCOUNTER — Other Ambulatory Visit (HOSPITAL_COMMUNITY)
Admission: RE | Admit: 2018-06-28 | Discharge: 2018-06-28 | Disposition: A | Discharge status: Home / Self Care | Payer: BC Managed Care – PPO | Source: Ambulatory Visit | Attending: Obstetrics and Gynecology | Admitting: Obstetrics and Gynecology

## 2018-06-28 ENCOUNTER — Ambulatory Visit (INDEPENDENT_AMBULATORY_CARE_PROVIDER_SITE_OTHER): Payer: BC Managed Care – PPO | Admitting: Obstetrics and Gynecology

## 2018-06-28 VITALS — BP 96/68 | Ht 59.0 in | Wt 170.8 lb

## 2018-06-28 DIAGNOSIS — Z1151 Encounter for screening for human papillomavirus (HPV): Secondary | ICD-10-CM | POA: Insufficient documentation

## 2018-06-28 DIAGNOSIS — Z3202 Encounter for pregnancy test, result negative: Secondary | ICD-10-CM | POA: Diagnosis not present

## 2018-06-28 DIAGNOSIS — Z124 Encounter for screening for malignant neoplasm of cervix: Secondary | ICD-10-CM

## 2018-06-28 DIAGNOSIS — N912 Amenorrhea, unspecified: Secondary | ICD-10-CM | POA: Diagnosis not present

## 2018-06-28 DIAGNOSIS — Z01419 Encounter for gynecological examination (general) (routine) without abnormal findings: Secondary | ICD-10-CM

## 2018-06-28 DIAGNOSIS — Z8742 Personal history of other diseases of the female genital tract: Secondary | ICD-10-CM | POA: Insufficient documentation

## 2018-06-28 DIAGNOSIS — N938 Other specified abnormal uterine and vaginal bleeding: Secondary | ICD-10-CM

## 2018-06-28 LAB — POCT URINE PREGNANCY: Preg Test, Ur: NEGATIVE

## 2018-06-28 MED ORDER — MEDROXYPROGESTERONE ACETATE 10 MG PO TABS: 10.0000 mg | ORAL_TABLET | Freq: Every day | ORAL | 0 refills | Status: DC

## 2018-06-28 NOTE — Progress Notes (Signed)
PCP:  Juline Patch, MD   Chief Complaint  Patient presents with  . Vaginal Bleeding    started bleeding around the 1st for about 5 days, stopped one day, then started again, no abnormal pain or any other symptom, first time ever that happens     HPI:      Ms. Veronica Whitaker is a 33 y.o. [id number] who LMP was Patient's last menstrual period was 06/14/2018 (approximate)., presents today for her NP annual examination and eval for cycle issues.  Her menses are Q3-7 months since birth of last child 2017, lasting 3-4 days, with 2 heavy days.  Dysmenorrhea none. She does not have intermenstrual bleeding. She had dark spotting for a week starting 06/14/18 that then turned into light, real bleeding starting 6/6. Heavier flow started 6/13 to present, but slowing down now. Flow has been a little heavier than usual with small clots, but no dysmen/pelvic pain. Very unusual for pt. Previous menses was 3/20. Pt with hx of amenorrhea in the past prior to pregnancies. Did depo and OCPs in past. No recent labs. No recent UPT.  Sex activity: single partner, contraception - none. Declines BC.  Last Pap: 3 yrs ago; s/p cryotx for HPV age [age]; normal paps since.  Hx of STDs: HPV  There is no FH of breast cancer. There is no FH of ovarian cancer. The patient does do self-breast exams.  Tobacco use: The patient denies current or previous tobacco use. Alcohol use: social drinker No drug use.  Exercise: moderately active  She does get adequate calcium and Vitamin D in her diet.   Past Medical History:  Diagnosis Date  . Frequent headaches   . Hx of one miscarriage     Past Surgical History:  Procedure Laterality Date  . CESAREAN SECTION    . CESAREAN SECTION N/A 04/30/2015   Procedure: CESAREAN SECTION;  Surgeon: Rubie Maid, MD;  Location: [facility] ORS;  Service: Obstetrics;  Laterality: N/A;    Family History  Problem Relation Age of Onset  . Hypertension Mother   . Stroke Sister   . Prostate  cancer Maternal Grandfather   . Breast cancer Other        father's mother  . Diabetes Maternal Grandmother     Social History   Socioeconomic History  . Marital status: Married    Spouse name: Not on file  . Number of children: Not on file  . Years of education: Not on file  . Highest education level: Not on file  Occupational History  . Occupation: Ship broker  Social Needs  . Financial resource strain: Not on file  . Food insecurity    Worry: Not on file    Inability: Not on file  . Transportation needs    Medical: Not on file    Non-medical: Not on file  Tobacco Use  . Smoking status: Former Smoker    Quit date: 01/13/2009    Years since quitting: 9.4  . Smokeless tobacco: Never Used  Substance and Sexual Activity  . Alcohol use: No  . Drug use: No  . Sexual activity: Yes    Birth control/protection: None  Lifestyle  . Physical activity    Days per week: Not on file    Minutes per session: Not on file  . Stress: Not on file  Relationships  . Social Herbalist on phone: Not on file    Gets together: Not on file    Attends religious service:  Not on file    Active member of club or organization: Not on file    Attends meetings of clubs or organizations: Not on file    Relationship status: Not on file  . Intimate partner violence    Fear of current or ex partner: Not on file    Emotionally abused: Not on file    Physically abused: Not on file    Forced sexual activity: Not on file  Other Topics Concern  . Not on file  Social History Narrative  . Not on file    Outpatient Medications Prior to Visit  Medication Sig Dispense Refill  . guaiFENesin-codeine (ROBITUSSIN AC) 100-10 MG/5ML syrup Take 5 mLs by mouth 3 (three) times daily as needed for cough. 100 mL 0   No facility-administered medications prior to visit.       ROS:  Review of Systems  Constitutional: Negative for fatigue, fever and unexpected weight change.  Respiratory: Negative for  cough, shortness of breath and wheezing.   Cardiovascular: Negative for chest pain, palpitations and leg swelling.  Gastrointestinal: Negative for blood in stool, constipation, diarrhea, nausea and vomiting.  Endocrine: Negative for cold intolerance, heat intolerance and polyuria.  Genitourinary: Positive for dyspareunia and menstrual problem. Negative for dysuria, flank pain, frequency, genital sores, hematuria, pelvic pain, urgency, vaginal bleeding, vaginal discharge and vaginal pain.  Musculoskeletal: Negative for back pain, joint swelling and myalgias.  Skin: Negative for rash.  Neurological: Negative for dizziness, syncope, light-headedness, numbness and headaches.  Hematological: Negative for adenopathy.  Psychiatric/Behavioral: Negative for agitation, confusion, sleep disturbance and suicidal ideas. The patient is not nervous/anxious.   BREAST: tenderness   Objective: BP 96/68   Ht 4\' 11"  (1.499 m)   Wt 170 lb 12.8 oz (77.5 kg)   LMP 06/14/2018 (Approximate)   BMI 34.50 kg/m    Physical Exam Constitutional:      Appearance: She is well-developed.  Genitourinary:     Vulva, cervix, uterus, right adnexa and left adnexa normal.     No vulval lesion or tenderness noted.     Vaginal bleeding present.     No vaginal discharge, erythema or tenderness.     No cervical polyp.     Uterus is not enlarged or tender.     No right or left adnexal mass present.     Right adnexa not tender.     Left adnexa not tender.  Neck:     Musculoskeletal: Normal range of motion.     Thyroid: No thyromegaly.  Cardiovascular:     Rate and Rhythm: Normal rate and regular rhythm.     Heart sounds: Normal heart sounds. No murmur.  Pulmonary:     Effort: Pulmonary effort is normal.     Breath sounds: Normal breath sounds.  Chest:     Breasts:        Right: No mass, nipple discharge, skin change or tenderness.        Left: No mass, nipple discharge, skin change or tenderness.  Abdominal:      Palpations: Abdomen is soft.     Tenderness: There is no abdominal tenderness. There is no guarding.  Musculoskeletal: Normal range of motion.  Neurological:     General: No focal deficit present.     Mental Status: She is alert and oriented to person, place, and time.     Cranial Nerves: No cranial nerve deficit.  Skin:    General: Skin is warm and dry.  Psychiatric:  Mood and Affect: Mood normal.        Behavior: Behavior normal.        Thought Content: Thought content normal.        Judgment: Judgment normal.  Vitals signs reviewed.     Results: Results for orders placed or performed in visit on 06/28/18 (from the past 24 hour(s))  POCT urine pregnancy     Status: Normal   Collection Time: 06/28/18  4:56 PM  Result Value Ref Range   Preg Test, Ur Negative Negative    Assessment/Plan: Encounter for annual routine gynecological examination   Cervical cancer screening - Plan: Cytology - PAP,   Screening for HPV (human papillomavirus) - Plan: Cytology - PAP,   History of abnormal cervical Pap smear - Plan: Cytology - PAP,   Amenorrhea - Plan: TSH, Prolactin, medroxyPROGESTERone (PROVERA) 10 MG tablet, Check labs. Discussed importance of withdrawal bleed Q3 months. [facility] Will do Q90 day provera instead. Pt knows to do UPT first. Rx eRxd. F/u prn.   DUB (dysfunctional uterine bleeding) - Plan: POCT urine pregnancy, this cycle; most likely due to anovulatory cycles. Will try prog if bleeding doesn't stop this wk.   Meds ordered this encounter  Medications  . medroxyPROGESTERone (PROVERA) 10 MG tablet    Sig: Take 1 tablet (10 mg total) by mouth daily for 7 days. Q90 days if no spontaneous menses    Dispense:  28 tablet    Refill:  0    Order Specific Question:   Supervising Provider    Answer:   Nadara MustardHARRIS, ROBERT P [[id number]]             GYN counsel adequate intake of calcium and vitamin D, diet and exercise     F/U  Prn or 1 yr annaul  Juliah Scadden B.  Dimitris Shanahan, PA-C 06/28/2018 4:58 PM

## 2018-06-28 NOTE — Patient Instructions (Signed)
I value your feedback and entrusting us with your care. If you get a [facility] patient survey, I would appreciate you taking the time to let us know about your experience today. Thank you! 

## 2018-06-29 ENCOUNTER — Telehealth: Payer: Self-pay | Admitting: Obstetrics and Gynecology

## 2018-06-29 LAB — TSH: TSH: 2.76 u[IU]/mL (ref 0.450–4.500)

## 2018-06-29 LAB — PROLACTIN: Prolactin: 13.9 ng/mL (ref 4.8–23.3)

## 2018-06-29 NOTE — Telephone Encounter (Signed)
For you -

## 2018-06-29 NOTE — Progress Notes (Signed)
Called pt, no answer, LVMTRC. 

## 2018-06-29 NOTE — Progress Notes (Signed)
Pls let pt know labs normal. F/u if bleeding isn't stopping by the end of the week. THx

## 2018-06-29 NOTE — Telephone Encounter (Signed)
Patient is returning missed call. Please advise 

## 2018-06-29 NOTE — Telephone Encounter (Signed)
Pt aware of lab results 

## 2018-07-01 LAB — CYTOLOGY - PAP
Diagnosis: NEGATIVE
HPV: NOT DETECTED

## 2018-07-02 ENCOUNTER — Encounter: Payer: Self-pay | Admitting: Obstetrics and Gynecology

## 2018-07-12 ENCOUNTER — Telehealth: Payer: Self-pay

## 2018-07-12 NOTE — Telephone Encounter (Signed)
Pt calling; was seen on 6/15 for ck up and cycle; cycle went off for a week and now it's back; flow is ridiculous, started Saturday.  [phone]  Pt actually called office before I could call her.  Adv TN to schedule her an appt which is for tomorrow.  I called pt to inquire of how heavy she is bleeding.  She states she is saturating a pad q41m-1hr.  Adv it is our policy for bleeding that heavily to go to the ED. Adv to leave appt scheduled and see what ED tells her.  May need to move appt out a few days. Pt sounded like she didn't feel good.

## 2018-07-13 ENCOUNTER — Ambulatory Visit (INDEPENDENT_AMBULATORY_CARE_PROVIDER_SITE_OTHER): Payer: BC Managed Care – PPO | Admitting: Obstetrics and Gynecology

## 2018-07-13 ENCOUNTER — Encounter: Payer: Self-pay | Admitting: Obstetrics and Gynecology

## 2018-07-13 ENCOUNTER — Other Ambulatory Visit: Payer: Self-pay

## 2018-07-13 ENCOUNTER — Ambulatory Visit (INDEPENDENT_AMBULATORY_CARE_PROVIDER_SITE_OTHER): Payer: BC Managed Care – PPO

## 2018-07-13 VITALS — BP 100/60 | Ht 60.0 in | Wt 169.0 lb

## 2018-07-13 DIAGNOSIS — N939 Abnormal uterine and vaginal bleeding, unspecified: Secondary | ICD-10-CM

## 2018-07-13 MED ORDER — NORETHINDRONE ACETATE 5 MG PO TABS: 5.0000 mg | ORAL_TABLET | Freq: Every day | ORAL | 0 refills | Status: DC

## 2018-07-13 NOTE — Patient Instructions (Signed)
I value your feedback and entrusting us with your care. If you get a  patient survey, I would appreciate you taking the time to let us know about your experience today. Thank you! 

## 2018-07-13 NOTE — Progress Notes (Signed)
Duanne LimerickJones, Deanna C, MD   Chief Complaint  Patient presents with  . Metrorrhagia    started bleeding this past sat, heavy bleeding, changing pads every 45 mins, pain in pelvic area    HPI:      Veronica Whitaker is a 33 y.o. [id number] who LMP was Patient's last menstrual period was 06/14/2018 (approximate)., presents today for AUB/menorrhagia sx f/u. Pt was seen 06/28/18 for annual with hx of amenorrhea and subsequent AUB this month. Had neg UPT, thyroid labs, neg pap. Pt's bleeding from that appt stopped 6/20 but then she started bleeding heavy again 6/27 with clots, particularly with valsalva maneuvers (laughing, bending over, etc). Has been changing pads/tampons Q45 min and having pelvic cramping/ache/pain (sx worse than normal menstrual sx). Taking ibup and tylenol with some relief.   Her menses are usually Q3-7 months since birth of last child 2017, lasting 3-4 days, with 2 heavy days.  Dysmenorrhea none. She does not have intermenstrual bleeding. She had dark spotting for a week starting 06/14/18 that then turned into light, real bleeding starting 6/6. Heavier flow started 6/13 to 6/15, but then slowed down, finally stopping 6/20. Flow was a little heavier than usual with small clots, but no dysmen/pelvic pain. Very unusual for pt. Previous menses was 3/20. Pt with hx of amenorrhea in the past prior to pregnancies. Did depo and OCPs in past.   [facility] at 06/28/18 annual so decided to do Q90 day provera withdrawal after neg UPT for amenorrhea sx.  Past Medical History:  Diagnosis Date  . Frequent headaches   . Hx of one miscarriage     Past Surgical History:  Procedure Laterality Date  . CESAREAN SECTION    . CESAREAN SECTION N/A 04/30/2015   Procedure: CESAREAN SECTION;  Surgeon: Hildred LaserAnika Cherry, MD;  Location: [facility] ORS;  Service: Obstetrics;  Laterality: N/A;    Family History  Problem Relation Age of Onset  . Hypertension Mother   . Stroke Sister   .  Prostate cancer Maternal Grandfather   . Breast cancer Other        father's mother  . Diabetes Maternal Grandmother     Social History   Socioeconomic History  . Marital status: Married    Spouse name: Not on file  . Number of children: Not on file  . Years of education: Not on file  . Highest education level: Not on file  Occupational History  . Occupation: Consulting civil engineerstudent  Social Needs  . Financial resource strain: Not on file  . Food insecurity    Worry: Not on file    Inability: Not on file  . Transportation needs    Medical: Not on file    Non-medical: Not on file  Tobacco Use  . Smoking status: Former Smoker    Quit date: 01/13/2009    Years since quitting: 9.5  . Smokeless tobacco: Never Used  Substance and Sexual Activity  . Alcohol use: No  . Drug use: No  . Sexual activity: Yes    Birth control/protection: None  Lifestyle  . Physical activity    Days per week: Not on file    Minutes per session: Not on file  . Stress: Not on file  Relationships  . Social Musicianconnections    Talks on phone: Not on file    Gets together: Not on file    Attends religious service: Not on file    Active member of club or  organization: Not on file    Attends meetings of clubs or organizations: Not on file    Relationship status: Not on file  . Intimate partner violence    Fear of current or ex partner: Not on file    Emotionally abused: Not on file    Physically abused: Not on file    Forced sexual activity: Not on file  Other Topics Concern  . Not on file  Social History Narrative  . Not on file    Outpatient Medications Prior to Visit  Medication Sig Dispense Refill  . medroxyPROGESTERone (PROVERA) 10 MG tablet Take 1 tablet (10 mg total) by mouth daily for 7 days. Q90 days if no spontaneous menses 28 tablet 0   No facility-administered medications prior to visit.       ROS:  Review of Systems  Constitutional: Negative for fatigue, fever and unexpected weight change.   Respiratory: Negative for cough, shortness of breath and wheezing.   Cardiovascular: Negative for chest pain, palpitations and leg swelling.  Gastrointestinal: Negative for blood in stool, constipation, diarrhea, nausea and vomiting.  Endocrine: Negative for cold intolerance, heat intolerance and polyuria.  Genitourinary: Positive for menstrual problem and vaginal bleeding. Negative for dyspareunia, dysuria, flank pain, frequency, genital sores, hematuria, pelvic pain, urgency, vaginal discharge and vaginal pain.  Musculoskeletal: Negative for back pain, joint swelling and myalgias.  Skin: Negative for rash.  Neurological: Negative for dizziness, syncope, light-headedness, numbness and headaches.  Hematological: Negative for adenopathy.  Psychiatric/Behavioral: Negative for agitation, confusion, sleep disturbance and suicidal ideas. The patient is not nervous/anxious.     OBJECTIVE:   Vitals:  BP 100/60   Ht 5' (1.524 m)   Wt 169 lb (76.7 kg)   LMP 06/14/2018 (Approximate)   BMI 33.01 kg/m   Physical Exam Vitals signs reviewed.  Constitutional:      Appearance: She is well-developed.  Neck:     Musculoskeletal: Normal range of motion.  Pulmonary:     Effort: Pulmonary effort is normal.  Musculoskeletal: Normal range of motion.  Skin:    General: Skin is warm and dry.  Neurological:     General: No focal deficit present.     Mental Status: She is alert and oriented to person, place, and time.     Cranial Nerves: No cranial nerve deficit.  Psychiatric:        Mood and Affect: Mood normal.        Behavior: Behavior normal.        Thought Content: Thought content normal.        Judgment: Judgment normal.     Results:  ULTRASOUND REPORT  Location: [facility]  Date of Service: 07/13/2018   Indications:Abnormal Uterine Bleeding Findings:  The uterus is anteverted and measures 9.7 x 5.2 x 4.8 cm. Echo texture is homogenous without evidence of focal masses.    The Endometrium measures 5.1 mm.  Right Ovary measures 3.8 x 2.2 x 2.2 cm cm. It is normal in appearance. Left Ovary measures 2.6 x 2.3 x 1.6 cm. It is normal in appearance. Survey of the adnexa demonstrates no adnexal masses. There is no free fluid in the cul de sac.  Impression: 1. Normal pelvic ultrasound.   Recommendations: 1.Clinical correlation with the patient's History and Physical Exam.   Gweneth Dimitri, RT  Assessment/Plan: Abnormal uterine bleeding (AUB) - Plan: US PELVIS TRANSVAGINAL NON-OB (TV ONLY), norethindrone (AYGESTIN) 5 MG tablet, Neg GYN u/s, EM=5.1 mm. Sx still c/w anovulatory cycle. Rx  aygestin for 10 days to stop bleeding. Pt to f/u if sx persist. If bleeding stops, then start Q90 day provera if no spontaneous menses. NSAIDs for dysmen.    Meds ordered this encounter  Medications  . norethindrone (AYGESTIN) 5 MG tablet    Sig: Take 1 tablet (5 mg total) by mouth daily for 10 days.    Dispense:  10 tablet    Refill:  0    Order Specific Question:   Supervising Provider    Answer:   Nadara MustardHARRIS, ROBERT P [[id number]]      Return if symptoms worsen or fail to improve.  Alicia B. Copland, PA-C 07/14/2018 8:16 AM

## 2018-07-14 ENCOUNTER — Encounter: Payer: Self-pay | Admitting: Obstetrics and Gynecology

## 2018-08-17 ENCOUNTER — Other Ambulatory Visit: Payer: Self-pay

## 2018-08-17 DIAGNOSIS — R6889 Other general symptoms and signs: Secondary | ICD-10-CM | POA: Diagnosis not present

## 2018-08-17 DIAGNOSIS — Z20822 Contact with and (suspected) exposure to covid-19: Secondary | ICD-10-CM

## 2018-08-18 LAB — NOVEL CORONAVIRUS, NAA: SARS-CoV-2, NAA: NOT DETECTED

## 2019-07-15 ENCOUNTER — Encounter: Payer: Self-pay | Admitting: Obstetrics

## 2019-07-15 ENCOUNTER — Other Ambulatory Visit: Payer: Self-pay

## 2019-07-15 ENCOUNTER — Other Ambulatory Visit: Payer: BC Managed Care – PPO | Admitting: Obstetrics

## 2019-07-15 ENCOUNTER — Ambulatory Visit (INDEPENDENT_AMBULATORY_CARE_PROVIDER_SITE_OTHER): Payer: BLUE CROSS/BLUE SHIELD | Admitting: Obstetrics

## 2019-07-15 VITALS — BP 80/50 | Ht 59.0 in | Wt 147.0 lb

## 2019-07-15 DIAGNOSIS — N898 Other specified noninflammatory disorders of vagina: Secondary | ICD-10-CM

## 2019-07-15 DIAGNOSIS — B9689 Other specified bacterial agents as the cause of diseases classified elsewhere: Secondary | ICD-10-CM | POA: Diagnosis not present

## 2019-07-15 DIAGNOSIS — N760 Acute vaginitis: Secondary | ICD-10-CM

## 2019-07-15 LAB — POCT WET PREP (WET MOUNT)
Clue Cells Wet Prep Whiff POC: POSITIVE
Trichomonas Wet Prep HPF POC: ABSENT

## 2019-07-15 LAB — POCT URINE PREGNANCY: Preg Test, Ur: NEGATIVE

## 2019-07-15 MED ORDER — METRONIDAZOLE 0.75 % VA GEL: 1.0000 | Freq: Every day | VAGINAL | 1 refills | Status: AC

## 2019-07-15 NOTE — Addendum Note (Signed)
Addended by: Donnetta Hail on: 07/15/2019 10:53 AM   Modules accepted: Orders

## 2019-07-15 NOTE — Patient Instructions (Signed)

## 2019-07-15 NOTE — Progress Notes (Signed)
Ms. Margarette Vannatter Coppens is a 34 y.o. [id number] who LMP was Patient's last menstrual period was 06/25/2019 (exact date)., presents today for a problem visit.   Patient complains of an abnormal vaginal discharge for 1 month. Discharge described as: white, thick and odorless. Vaginal symptoms include none.   Other associated symptoms: none.Menstrual pattern: She had been bleeding regularly. Contraception: none.  She denies recent antibiotic exposure, denies changes in soaps, detergents coinciding with the onset of her symptoms.  She has not previously self treated or been under treatment by another provider for these symptoms.  Vernel shares that there is no itching or irritation, rather , she has noticed an increase in the amount of her daily discharge. She wants to be sure there is no infection.  She is married, monogamous, and uses condoms for [facility]. She wants to avoid hormonal BC at this time. Feels that she is finished having children, although her husband would like a daughter.   O: Wet mount: positive whiff, +clue cells, no hyphae or buds seen Vaginal exam, by spec: scant tacky white DC noted. Normal rugae.  Shaves mons pubis, no irritation. Rashes or lesions noted   A: BV  1) Risk factors for bacterial vaginosis and candida infections discussed.  We discussed normal vaginal flora/microbiome.  Any factors that may alter the microbiome increase the risk of these opportunistic infections.  These include changes in pH, antibiotic exposures, diabetes, wet bathing suits etc.  We discussed that treatment is aimed at eradicating abnormal bacterial overgrowth and or yeast.  There may be some role for vaginal probiotics in restoring normal vaginal flora.   Rx for Metrogel sent to her pharmacy. She is also offered oral Flagyl, but prefers the gel. RTC PRN for any continuing problems. Encouraged her to make an appointment for an annual PE. Mirna Mires, CNM  07/15/2019 9:15 AM

## 2019-08-03 ENCOUNTER — Emergency Department
Admission: EM | Admit: 2019-08-03 | Discharge: 2019-08-03 | Disposition: A | Discharge status: Home / Self Care | Payer: BLUE CROSS/BLUE SHIELD | Attending: Emergency Medicine | Admitting: Emergency Medicine

## 2019-08-03 ENCOUNTER — Encounter: Payer: Self-pay | Admitting: Obstetrics and Gynecology

## 2019-08-03 ENCOUNTER — Other Ambulatory Visit: Payer: Self-pay

## 2019-08-03 ENCOUNTER — Ambulatory Visit (INDEPENDENT_AMBULATORY_CARE_PROVIDER_SITE_OTHER): Payer: BLUE CROSS/BLUE SHIELD | Admitting: Obstetrics and Gynecology

## 2019-08-03 ENCOUNTER — Emergency Department: Payer: BLUE CROSS/BLUE SHIELD

## 2019-08-03 ENCOUNTER — Other Ambulatory Visit (INDEPENDENT_AMBULATORY_CARE_PROVIDER_SITE_OTHER): Payer: BLUE CROSS/BLUE SHIELD

## 2019-08-03 VITALS — BP 104/70 | Temp 98.3°F | Ht 59.0 in | Wt 147.0 lb

## 2019-08-03 DIAGNOSIS — Z87891 Personal history of nicotine dependence: Secondary | ICD-10-CM | POA: Insufficient documentation

## 2019-08-03 DIAGNOSIS — B9689 Other specified bacterial agents as the cause of diseases classified elsewhere: Secondary | ICD-10-CM

## 2019-08-03 DIAGNOSIS — R1031 Right lower quadrant pain: Secondary | ICD-10-CM

## 2019-08-03 DIAGNOSIS — N760 Acute vaginitis: Secondary | ICD-10-CM

## 2019-08-03 LAB — POCT URINALYSIS DIPSTICK
Bilirubin, UA: NEGATIVE
Glucose, UA: NEGATIVE
Ketones, UA: NEGATIVE
Protein, UA: POSITIVE — AB
Spec Grav, UA: 1.015 (ref 1.010–1.025)
Urobilinogen, UA: NEGATIVE E.U./dL — AB
pH, UA: 6.5 (ref 5.0–8.0)

## 2019-08-03 LAB — URINALYSIS, COMPLETE (UACMP) WITH MICROSCOPIC
Bacteria, UA: NONE SEEN
Bilirubin Urine: NEGATIVE
Glucose, UA: NEGATIVE mg/dL
Hgb urine dipstick: NEGATIVE
Ketones, ur: 5 mg/dL — AB
Nitrite: NEGATIVE
Protein, ur: NEGATIVE mg/dL
Specific Gravity, Urine: 1.025 (ref 1.005–1.030)
pH: 7 (ref 5.0–8.0)

## 2019-08-03 LAB — COMPREHENSIVE METABOLIC PANEL
ALT: 16 U/L (ref 0–44)
AST: 15 U/L (ref 15–41)
Albumin: 4.2 g/dL (ref 3.5–5.0)
Alkaline Phosphatase: 46 U/L (ref 38–126)
Anion gap: 6 (ref 5–15)
BUN: 9 mg/dL (ref 6–20)
CO2: 26 mmol/L (ref 22–32)
Calcium: 8.7 mg/dL — ABNORMAL LOW (ref 8.9–10.3)
Chloride: 106 mmol/L (ref 98–111)
Creatinine, Ser: 0.71 mg/dL (ref 0.44–1.00)
GFR calc Af Amer: >60 mL/min (ref >60)
GFR calc non Af Amer: >60 mL/min (ref >60)
Glucose, Bld: 87 mg/dL (ref 70–99)
Potassium: 4.1 mmol/L (ref 3.5–5.1)
Sodium: 138 mmol/L (ref 135–145)
Total Bilirubin: 0.6 mg/dL (ref 0.3–1.2)
Total Protein: 7.6 g/dL (ref 6.5–8.1)

## 2019-08-03 LAB — CBC
HCT: 38 % (ref 36.0–46.0)
Hemoglobin: 12.5 g/dL (ref 12.0–15.0)
MCH: 27.3 pg (ref 26.0–34.0)
MCHC: 32.9 g/dL (ref 30.0–36.0)
MCV: 83 fL (ref 80.0–100.0)
Platelets: 290 10*3/uL (ref 150–400)
RBC: 4.58 MIL/uL (ref 3.87–5.11)
RDW: 14 % (ref 11.5–15.5)
WBC: 14.6 10*3/uL — ABNORMAL HIGH (ref 4.0–10.5)
nRBC: 0 % (ref 0.0–0.2)

## 2019-08-03 LAB — LIPASE, BLOOD: Lipase: 39 U/L (ref 11–51)

## 2019-08-03 LAB — POCT PREGNANCY, URINE: Preg Test, Ur: NEGATIVE

## 2019-08-03 LAB — WET PREP, GENITAL
Sperm: NONE SEEN
Trich, Wet Prep: NONE SEEN
Yeast Wet Prep HPF POC: NONE SEEN

## 2019-08-03 LAB — CHLAMYDIA/NGC RT PCR (ARMC ONLY)
Chlamydia Tr: NOT DETECTED
N gonorrhoeae: NOT DETECTED

## 2019-08-03 LAB — POCT URINE PREGNANCY: Preg Test, Ur: NEGATIVE

## 2019-08-03 MED ORDER — DICYCLOMINE HCL 10 MG PO CAPS
10.0000 mg | ORAL_CAPSULE | Freq: Once | ORAL | Status: AC
  Administered 2019-08-03: 10 mg via ORAL
  Filled 2019-08-03: qty 1

## 2019-08-03 MED ORDER — IOHEXOL 9 MG/ML PO SOLN
1000.0000 mL | Freq: Once | ORAL | Status: DC | PRN
  Administered 2019-08-03: 1000 mL via ORAL
  Filled 2019-08-03 (×2): qty 1000

## 2019-08-03 MED ORDER — DICYCLOMINE HCL 10 MG PO CAPS: 10.0000 mg | ORAL_CAPSULE | Freq: Four times a day (QID) | ORAL | 0 refills | Status: DC

## 2019-08-03 MED ORDER — IOHEXOL 300 MG/ML  SOLN
100.0000 mL | Freq: Once | INTRAMUSCULAR | Status: AC | PRN
  Administered 2019-08-03: 100 mL via INTRAVENOUS
  Filled 2019-08-03: qty 100

## 2019-08-03 MED ORDER — METRONIDAZOLE 500 MG PO TABS: 500.0000 mg | ORAL_TABLET | Freq: Two times a day (BID) | ORAL | 0 refills | Status: AC

## 2019-08-03 NOTE — Discharge Instructions (Signed)
TheFollow-up with the [facility] department if your STD testing is positive.  Take the medication as prescribed Return if worsening abdominal pain

## 2019-08-03 NOTE — ED Notes (Signed)
See triage note  States she sent in by OB/GYN  States she developed some RLQ pain  Did have US done  Sent here to r/o appendix  Low grade feer on arrival

## 2019-08-03 NOTE — ED Provider Notes (Addendum)
[facility] Emergency Department Provider Note   ____________________________________________   First MD Initiated Contact with Patient 08/03/19 1404     (approximate)  I have reviewed the triage vital signs and the nursing notes.   HISTORY  Chief Complaint Abdominal Pain    HPI Veronica Whitaker is a 34 y.o. female arise from OB/GYN clinic for right lower quadrant pain.  Patient had ultrasound performed GYN of the uterus and ovaries and they were negative.  Patient sent to to rule out appendicitis.  Patient denies nausea vomiting.  Patient stated loose stools.  Patient denies fever.  Onset of pain began 3 days ago.  Patient denies urinary complaints.  Patient rates the pain as a 6/10.  Patient described the pain as "achy.  Patient said pain increased with coughing or sneezing.  Patient also states pain increases with laughing.         Past Medical History:  Diagnosis Date  . Frequent headaches   . Hx of one miscarriage     Patient Active Problem List   Diagnosis Date Noted  . BV (bacterial vaginosis) 07/15/2019  . S/P cesarean section 05/02/2015  . Marijuana smoker (HCC) 09/25/2014    Past Surgical History:  Procedure Laterality Date  . CESAREAN SECTION    . CESAREAN SECTION N/A 04/30/2015   Procedure: CESAREAN SECTION;  Surgeon: Hildred Laser, MD;  Location: [facility] ORS;  Service: Obstetrics;  Laterality: N/A;    Prior to Admission medications   Not on File    Allergies Patient has no known allergies.  Family History  Problem Relation Age of Onset  . Hypertension Mother   . Stroke Sister   . Prostate cancer Maternal Grandfather   . Diabetes Maternal Grandmother     Social History Social History   Tobacco Use  . Smoking status: Former Smoker    Quit date: 01/13/2009    Years since quitting: 10.5  . Smokeless tobacco: Never Used  Vaping Use  . Vaping Use: Never used  Substance Use Topics  . Alcohol use: Yes    Comment:  occassional  . Drug use: No    Review of Systems Constitutional: No fever/chills Eyes: No visual changes. ENT: No sore throat. Cardiovascular: Denies chest pain. Respiratory: Denies shortness of breath. Gastrointestinal: Right lower quadrant abdominal pain.  No nausea, no vomiting.  No diarrhea.  No constipation. Genitourinary: Negative for dysuria. Musculoskeletal: Negative for back pain. Skin: Negative for rash. Neurological: Negative for headaches, focal weakness or numbness.   ____________________________________________   PHYSICAL EXAM:  VITAL SIGNS: ED Triage Vitals  Enc Vitals Group     BP 08/03/19 1207 119/76     Pulse Rate 08/03/19 1207 85     Resp 08/03/19 1207 16     Temp 08/03/19 1207 99.1 F (37.3 C)     Temp Source 08/03/19 1207 Oral     SpO2 08/03/19 1207 100 %     Weight 08/03/19 1208 147 lb (66.7 kg)     Height 08/03/19 1208 4\' 11"  (1.499 m)     Head Circumference --      Peak Flow --      Pain Score 08/03/19 1208 6     Pain Loc --      Pain Edu? --      Excl. in GC? --    Constitutional: Alert and oriented. Well appearing and in no acute distress. Cardiovascular: Normal rate, regular rhythm. Grossly normal heart sounds.  Good peripheral circulation. Respiratory:  Normal respiratory effort.  No retractions. Lungs CTAB. Gastrointestinal: Soft and right lower quadrant guarding.. No distention. No abdominal bruits. No CVA tenderness. Genitourinary: Deferred Skin:  Skin is warm, dry and intact. No rash noted.   ____________________________________________   LABS (all labs ordered are listed, but only abnormal results are displayed)  Labs Reviewed  COMPREHENSIVE METABOLIC PANEL - Abnormal; Notable for the following components:      Result Value   Calcium 8.7 (*)    All other components within normal limits  CBC - Abnormal; Notable for the following components:   WBC 14.6 (*)    All other components within normal limits  URINALYSIS, COMPLETE  (UACMP) WITH MICROSCOPIC - Abnormal; Notable for the following components:   Color, Urine YELLOW (*)    APPearance CLOUDY (*)    Ketones, ur 5 (*)    Leukocytes,Ua LARGE (*)    All other components within normal limits  LIPASE, BLOOD  POCT PREGNANCY, URINE  POC URINE PREG, ED   ____________________________________________  EKG   ____________________________________________  RADIOLOGY  ED MD interpretation:    Official radiology report(s): No results found.  ____________________________________________   PROCEDURES  Procedure(s) performed (including Critical Care):  Procedures   ____________________________________________   INITIAL IMPRESSION / ASSESSMENT AND PLAN / ED COURSE  As part of my medical decision making, I reviewed the following data within the electronic MEDICAL RECORD NUMBER     Patient presents with right lower quadrant abdominal pain for 3 days.  Differential consist of appendicitis, ovarian cyst, or UTI.  Further evaluation with urinalysis and CT scan of the abdomen with contrast.Susan Fisher PA-C will assume care of patient.    [name] was evaluated in Emergency Department on 08/03/2019 for the symptoms described in the history of present illness. She was evaluated in the context of the global COVID-19 pandemic, which necessitated consideration that the patient might be at risk for infection with the SARS-CoV-2 virus that causes COVID-19. Institutional protocols and algorithms that pertain to the evaluation of patients at risk for COVID-19 are in a state of rapid change based on information released by regulatory bodies including the CDC and federal and state organizations. These policies and algorithms were followed during the patient's care in the ED.       ____________________________________________   FINAL CLINICAL IMPRESSION(S) / ED DIAGNOSES  Final diagnoses:  Right lower quadrant abdominal pain     ED Discharge Orders    None         Note:  This document was prepared using Dragon voice recognition software and may include unintentional dictation errors.    Reham, Slabaugh, PA-C 08/03/19 1534    Joni Reining, PA-C 08/03/19 1536    Jene Every, MD 08/03/19 1539

## 2019-08-03 NOTE — Progress Notes (Signed)
Veronica Limerick, MD   Chief Complaint  Patient presents with  . Pelvic Pain    right side only, sensitive to touch, no uti sx    HPI:      Ms. Veronica Whitaker is a 34 y.o. [id number] whose LMP was Patient's last menstrual period was 07/23/2019 (approximate)., presents today for worsening RLQ pain the past 4 days. Pain in sharp, achy and sometimes with pressure. Sx intermittent but worse with sitting, valsalva maneuvers such as sneezing and having BM. No meds taken for sx. Has also had loose stools since pain started, no blood in stools/rectal bleeding, no n/v. No urin sx, no vag sx, no fevers. She is sex active with husband, no new partners. No dyspareunia/bleeding. Treated for BV with metrogel 07/15/19 with sx relief. Still has appendix. Hx of irregular menses and AUB 6/20 with Neg GYN u/s 6/20. Having monthly menses now, last one last wk. No recent UPT done.  Went to urgent care for sx yesterday, no eval done.   Past Medical History:  Diagnosis Date  . Frequent headaches   . Hx of one miscarriage     Past Surgical History:  Procedure Laterality Date  . CESAREAN SECTION    . CESAREAN SECTION N/A 04/30/2015   Procedure: CESAREAN SECTION;  Surgeon: Hildred Laser, MD;  Location: [facility] ORS;  Service: Obstetrics;  Laterality: N/A;    Family History  Problem Relation Age of Onset  . Hypertension Mother   . Stroke Sister   . Prostate cancer Maternal Grandfather   . Diabetes Maternal Grandmother     Social History   Socioeconomic History  . Marital status: Married    Spouse name: Not on file  . Number of children: Not on file  . Years of education: Not on file  . Highest education level: Not on file  Occupational History  . Occupation: Consulting civil engineer  Tobacco Use  . Smoking status: Former Smoker    Quit date: 01/13/2009    Years since quitting: 10.5  . Smokeless tobacco: Never Used  Vaping Use  . Vaping Use: Never used  Substance and Sexual Activity  . Alcohol use: No  . Drug  use: No  . Sexual activity: Yes    Birth control/protection: None, Condom  Other Topics Concern  . Not on file  Social History Narrative  . Not on file   Social Determinants of Health   Financial Resource Strain:   . Difficulty of Paying Living Expenses:   Food Insecurity:   . Worried About Programme researcher, broadcasting/film/video in the Last Year:   . Barista in the Last Year:   Transportation Needs:   . Freight forwarder (Medical):   Marland Kitchen Lack of Transportation (Non-Medical):   Physical Activity:   . Days of Exercise per Week:   . Minutes of Exercise per Session:   Stress:   . Feeling of Stress :   Social Connections:   . Frequency of Communication with Friends and Family:   . Frequency of Social Gatherings with Friends and Family:   . Attends Religious Services:   . Active Member of Clubs or Organizations:   . Attends Banker Meetings:   Marland Kitchen Marital Status:   Intimate Partner Violence:   . Fear of Current or Ex-Partner:   . Emotionally Abused:   Marland Kitchen Physically Abused:   . Sexually Abused:     No outpatient medications prior to visit.   No facility-administered medications prior  to visit.      ROS:  Review of Systems  Constitutional: Negative for fever.  Gastrointestinal: Positive for diarrhea. Negative for blood in stool, constipation, nausea and vomiting.  Genitourinary: Positive for pelvic pain. Negative for dyspareunia, dysuria, flank pain, frequency, hematuria, urgency, vaginal bleeding, vaginal discharge and vaginal pain.  Musculoskeletal: Negative for back pain.  Skin: Negative for rash.    OBJECTIVE:   Vitals:  BP 104/70   Temp 98.3 F (36.8 C)   Ht 4\' 11"  (1.499 m)   Wt 147 lb (66.7 kg)   LMP 07/23/2019 (Approximate)   BMI 29.69 kg/m   Physical Exam Vitals reviewed.  Constitutional:      Appearance: She is well-developed.  Pulmonary:     Effort: Pulmonary effort is normal.  Abdominal:     Palpations: Abdomen is soft.     Tenderness:  There is generalized abdominal tenderness. There is guarding. There is no rebound.  Genitourinary:    General: Normal vulva.     Pubic Area: No rash.      Labia:        Right: No rash, tenderness or lesion.        Left: No rash, tenderness or lesion.      Vagina: Normal. No vaginal discharge, erythema or tenderness.     Cervix: No cervical motion tenderness.     Uterus: Normal. Tender. Not enlarged.      Adnexa: Right adnexa normal and left adnexa normal.       Right: No mass or tenderness.         Left: No mass or tenderness.    Musculoskeletal:        General: Normal range of motion.     Cervical back: Normal range of motion.  Skin:    General: Skin is warm and dry.  Neurological:     General: No focal deficit present.     Mental Status: She is alert and oriented to person, place, and time.  Psychiatric:        Mood and Affect: Mood normal.        Behavior: Behavior normal.        Thought Content: Thought content normal.        Judgment: Judgment normal.     Results: Results for orders placed or performed in visit on 08/03/19 (from the past 24 hour(s))  POCT urine pregnancy     Status: Normal   Collection Time: 08/03/19 10:26 AM  Result Value Ref Range   Preg Test, Ur Negative Negative  POCT Urinalysis Dipstick     Status: Abnormal   Collection Time: 08/03/19 10:26 AM  Result Value Ref Range   Color, UA     Clarity, UA     Glucose, UA Negative Negative   Bilirubin, UA neg    Ketones, UA neg    Spec Grav, UA 1.015 1.010 - 1.025   Blood, UA 5-10    pH, UA 6.5 5.0 - 8.0   Protein, UA Positive (A) Negative   Urobilinogen, UA negative (A) 0.2 or 1.0 E.U./dL   Nitrite, UA     Leukocytes, UA Small (1+) (A) Negative   Appearance     Odor      ULTRASOUND REPORT  Location: [facility]  Date of Service: 08/03/2019    Indications:Pelvic Pain   Findings:  The uterus is anteverted and measures 9.9 x 5.0 x 4.7 cm. Echo texture is homogenous without evidence  of focal masses. The Endometrium measures  4.7 mm.  Right Ovary measures 3.7 x 3.5 x 2.4 cm. It is normal in appearance. Left Ovary measures 3.5 x 2.1 x 1.9 cm. It is normal in appearance. Survey of the adnexa demonstrates no adnexal masses. There is no free fluid in the cul de sac.  Impression: 1. Normal pelvic ultrasound.   Recommendations: 1.Clinical correlation with the patient's History and Physical Exam.   Deanna Artis, RT  Assessment/Plan: RLQ abdominal pain - Plan: US PELVIS TRANSVAGINAL NON-OB (TV ONLY), POCT urine pregnancy, POCT Urinalysis Dipstick; VERY tender on abd exam, less tender on GYN exam. Neg UPT, neg UA, neg GYN u/s for RLQ pain. Given sx severity and pt discomfort, suggested she go to ED to rule out appendicitis/further eval. If has neg eval, could treat empirically for PID with abx. Will f/u with pt re: sx and eval.     Return if symptoms worsen or fail to improve.  Avice Funchess B. Jancarlo Biermann, PA-C 08/03/2019 11:22 AM

## 2019-08-03 NOTE — Patient Instructions (Signed)
I value your feedback and entrusting us with your care. If you get a [facility] patient survey, I would appreciate you taking the time to let us know about your experience today. Thank you!  As of December 23, 2018, your lab results will be released to your MyChart immediately, before I even have a chance to see them. Please give me time to review them and contact you if there are any abnormalities. Thank you for your patience.  

## 2019-08-03 NOTE — ED Triage Notes (Signed)
Pt arrives to ER from OBGYN for RLQ abd pain. Had US performed at [facility] of uterus and ovaries and was negative. Sent here to r/o appendicitis. Denies N&V&D and fever. Symptoms began Sunday. Denies urinary symptoms.

## 2019-08-03 NOTE — ED Provider Notes (Signed)
°  Physical Exam  BP 119/76 (BP Location: Right Arm)    Pulse 85    Temp 99.1 F (37.3 C) (Oral)    Resp 16    Ht 4\' 11"  (1.499 m)    Wt 66.7 kg    LMP 07/23/2019 (Approximate)    SpO2 100%    BMI 29.69 kg/m   Physical Exam  ED Course/Procedures     Procedures  MDM  Patient is 34 year old female presents emergency department to rule out appendicitis.  Received care from our [place], [place]. CT abdomen/pelvis is pending  ----------------------------------------- 6:22 PM on 08/03/2019 -----------------------------------------  CT abdomen/pelvis is negative for appendicitis.  Bentyl p.o. helped somewhat with abdominal cramping but patient still complaining of pain.  Phone we talked about vaginal discharge she states she does have heavy vaginal discharge.  She states she does not think they did STD testing at her regular doctors.  We will perform STD testing for her along with a wet prep. Wet prep shows clue cells and white blood cells.  Patient be treated for bacterial vaginosis.  She has my chart and will look at the results for STD testing.  If positive she can either return emergency department or go to the [facility] department.  States she understands.  She is discharged stable condition.      [facility], PA-C 08/03/19 08/05/19, MD 08/03/19 2015

## 2019-08-04 ENCOUNTER — Encounter: Payer: Self-pay | Admitting: Obstetrics and Gynecology

## 2019-08-04 LAB — URINE CULTURE: Culture: NO GROWTH

## 2019-08-25 NOTE — Progress Notes (Signed)
PCP:  Duanne Limerick, MD   Chief Complaint  Patient presents with  . Gynecologic Exam    No complaints     HPI:      Ms. Veronica Whitaker is a 34 y.o. [id number] who LMP was Patient's last menstrual period was 08/21/2019., presents today for her annual examination and eval for cycle issues.  Her menses are monthly, lasting 4-5 day, no BTB, mild dysmen, improved with NSAIDs. AUB from last yr resolved, had neg GYN u/s.   Was seen 7/21 for severe RLQ pain and sent to ED to rule out appendicitis. Had neg CT. ED thought it was GI and pt was given bentyl which helped somewhat. Pt still with RLQ pain, particularly after eating, and then has loose stools. Trying not to eat to avoid pain and has lost wt. Could use Rf on bentyl.  Sex activity: single partner, contraception - none/condoms. Declines BC.  Last Pap: 06/28/18 Results: no abnormalities/ neg HPV DNA; s/p cryotx for HPV age 30/16; normal paps since.  Hx of STDs: HPV  There is no FH of breast cancer. There is no FH of ovarian cancer. The patient does do self-breast exams.  Tobacco use: The patient denies current or previous tobacco use. Alcohol use: social drinker No drug use.  Exercise: moderately active  She does get adequate calcium but not Vitamin D in her diet.    Past Medical History:  Diagnosis Date  . Frequent headaches   . Hx of one miscarriage     Past Surgical History:  Procedure Laterality Date  . CESAREAN SECTION    . CESAREAN SECTION N/A 04/30/2015   Procedure: CESAREAN SECTION;  Surgeon: Hildred Laser, MD;  Location: [facility] ORS;  Service: Obstetrics;  Laterality: N/A;    Family History  Problem Relation Age of Onset  . Hypertension Mother   . Stroke Sister   . Prostate cancer Maternal Grandfather   . Diabetes Maternal Grandmother     Social History   Socioeconomic History  . Marital status: Married    Spouse name: Not on file  . Number of children: Not on file  . Years of education: Not on file  .  Highest education level: Not on file  Occupational History  . Occupation: Consulting civil engineer  Tobacco Use  . Smoking status: Former Smoker    Quit date: 01/13/2009    Years since quitting: 10.6  . Smokeless tobacco: Never Used  Vaping Use  . Vaping Use: Never used  Substance and Sexual Activity  . Alcohol use: Yes    Comment: occassional  . Drug use: No  . Sexual activity: Yes    Birth control/protection: None, Condom  Other Topics Concern  . Not on file  Social History Narrative  . Not on file   Social Determinants of Health   Financial Resource Strain:   . Difficulty of Paying Living Expenses:   Food Insecurity:   . Worried About Programme researcher, broadcasting/film/video in the Last Year:   . Barista in the Last Year:   Transportation Needs:   . Freight forwarder (Medical):   Marland Kitchen Lack of Transportation (Non-Medical):   Physical Activity:   . Days of Exercise per Week:   . Minutes of Exercise per Session:   Stress:   . Feeling of Stress :   Social Connections:   . Frequency of Communication with Friends and Family:   . Frequency of Social Gatherings with Friends and Family:   .  Attends Religious Services:   . Active Member of Clubs or Organizations:   . Attends Banker Meetings:   Marland Kitchen Marital Status:   Intimate Partner Violence:   . Fear of Current or Ex-Partner:   . Emotionally Abused:   Marland Kitchen Physically Abused:   . Sexually Abused:     Outpatient Medications Prior to Visit  Medication Sig Dispense Refill  . dicyclomine (BENTYL) 10 MG capsule Take 1 capsule (10 mg total) by mouth 4 (four) times daily for 14 days. 56 capsule 0   No facility-administered medications prior to visit.      ROS:  Review of Systems  Constitutional: Negative for fatigue, fever and unexpected weight change.  Respiratory: Negative for cough, shortness of breath and wheezing.   Cardiovascular: Negative for chest pain, palpitations and leg swelling.  Gastrointestinal: Positive for diarrhea.  Negative for blood in stool, constipation, nausea and vomiting.  Endocrine: Negative for cold intolerance, heat intolerance and polyuria.  Genitourinary: Positive for pelvic pain. Negative for dyspareunia, dysuria, flank pain, frequency, genital sores, hematuria, menstrual problem, urgency, vaginal bleeding, vaginal discharge and vaginal pain.  Musculoskeletal: Negative for back pain, joint swelling and myalgias.  Skin: Negative for rash.  Neurological: Negative for dizziness, syncope, light-headedness, numbness and headaches.  Hematological: Negative for adenopathy.  Psychiatric/Behavioral: Negative for agitation, confusion, sleep disturbance and suicidal ideas. The patient is not nervous/anxious.   BREAST: tenderness   Objective: BP 98/62 (BP Location: Left Arm, Patient Position: Sitting, Cuff Size: Normal)   Pulse 62   Ht 4\' 11"  (1.499 m)   Wt 144 lb (65.3 kg)   LMP 08/21/2019   BMI 29.08 kg/m    Physical Exam Constitutional:      Appearance: She is well-developed.  Genitourinary:     Vulva, cervix, uterus, right adnexa and left adnexa normal.     No vulval lesion or tenderness noted.     Vaginal bleeding present.     No vaginal discharge, erythema or tenderness.     No cervical polyp.     Uterus is not enlarged or tender.     No right or left adnexal mass present.     Right adnexa not tender.     Left adnexa not tender.  Neck:     Thyroid: No thyromegaly.  Cardiovascular:     Rate and Rhythm: Normal rate and regular rhythm.     Heart sounds: Normal heart sounds. No murmur heard.   Pulmonary:     Effort: Pulmonary effort is normal.     Breath sounds: Normal breath sounds.  Chest:     Breasts:        Right: No mass, nipple discharge, skin change or tenderness.        Left: No mass, nipple discharge, skin change or tenderness.  Abdominal:     Palpations: Abdomen is soft.     Tenderness: There is no abdominal tenderness. There is no guarding.  Musculoskeletal:         General: Normal range of motion.     Cervical back: Normal range of motion.  Neurological:     General: No focal deficit present.     Mental Status: She is alert and oriented to person, place, and time.     Cranial Nerves: No cranial nerve deficit.  Skin:    General: Skin is warm and dry.  Psychiatric:        Mood and Affect: Mood normal.        Behavior: Behavior  normal.        Thought Content: Thought content normal.        Judgment: Judgment normal.  Vitals reviewed.     Assessment/Plan: Encounter for annual routine gynecological examination  RLQ abdominal pain--persistent for a month. Neg GYN eval. Somewhat improved with bentyl, Rx RF. Could be IBS. Pt to f/u with PCP, may need to see GI.  Spasm of bowel - Plan: dicyclomine (BENTYL) 10 MG capsule   Meds ordered this encounter  Medications  . dicyclomine (BENTYL) 10 MG capsule    Sig: Take 1 capsule (10 mg total) by mouth 4 (four) times daily as needed for spasms.    Dispense:  60 capsule    Refill:  0    Order Specific Question:   Supervising Provider    Answer:   Nadara Mustard [[id number]]             GYN counsel adequate intake of calcium and vitamin D, diet and exercise     F/U  Return in about 1 year (around 08/25/2020).   Jaceion Aday B. Meta Kroenke, PA-C 08/26/2019 10:45 AM

## 2019-08-25 NOTE — Patient Instructions (Signed)
I value your feedback and entrusting us with your care. If you get a [facility] patient survey, I would appreciate you taking the time to let us know about your experience today. Thank you!  As of December 23, 2018, your lab results will be released to your MyChart immediately, before I even have a chance to see them. Please give me time to review them and contact you if there are any abnormalities. Thank you for your patience.  

## 2019-08-26 ENCOUNTER — Other Ambulatory Visit: Payer: Self-pay

## 2019-08-26 ENCOUNTER — Encounter: Payer: Self-pay | Admitting: Obstetrics and Gynecology

## 2019-08-26 ENCOUNTER — Ambulatory Visit (INDEPENDENT_AMBULATORY_CARE_PROVIDER_SITE_OTHER): Payer: BLUE CROSS/BLUE SHIELD | Admitting: Obstetrics and Gynecology

## 2019-08-26 VITALS — BP 98/62 | HR 62 | Ht 59.0 in | Wt 144.0 lb

## 2019-08-26 DIAGNOSIS — K589 Irritable bowel syndrome without diarrhea: Secondary | ICD-10-CM

## 2019-08-26 DIAGNOSIS — R1031 Right lower quadrant pain: Secondary | ICD-10-CM | POA: Diagnosis not present

## 2019-08-26 DIAGNOSIS — Z01419 Encounter for gynecological examination (general) (routine) without abnormal findings: Secondary | ICD-10-CM

## 2019-08-26 MED ORDER — DICYCLOMINE HCL 10 MG PO CAPS: 10.0000 mg | ORAL_CAPSULE | Freq: Four times a day (QID) | ORAL | 0 refills | Status: DC | PRN

## 2019-12-24 ENCOUNTER — Ambulatory Visit: Payer: Self-pay | Attending: Internal Medicine

## 2019-12-24 DIAGNOSIS — Z23 Encounter for immunization: Secondary | ICD-10-CM

## 2019-12-24 NOTE — Progress Notes (Signed)
° °  Covid-19 Vaccination Clinic  Name:  Veronica Whitaker    MRN: [id number] DOB: 1985-11-12  12/24/2019  [name] was observed post Covid-19 immunization for 15 minutes without incident. She was provided with Vaccine Information Sheet and instruction to access the V-Safe system.   [name] was instructed to call 911 with any severe reactions post vaccine:  Difficulty breathing   Swelling of face and throat   A fast heartbeat   A bad rash all over body   Dizziness and weakness   Immunizations Administered    Name Date Dose VIS Date Route   Pfizer COVID-19 Vaccine 12/24/2019  1:02 PM 0.3 mL 11/02/2019 Intramuscular   Manufacturer: [employer]   Lot: [id number]   NDC: [id number]

## 2019-12-30 ENCOUNTER — Ambulatory Visit: Payer: Self-pay | Admitting: Family Medicine

## 2020-06-12 ENCOUNTER — Encounter: Payer: Self-pay | Admitting: Family Medicine

## 2020-09-13 ENCOUNTER — Other Ambulatory Visit: Payer: Self-pay

## 2020-09-13 ENCOUNTER — Ambulatory Visit (INDEPENDENT_AMBULATORY_CARE_PROVIDER_SITE_OTHER): Payer: Managed Care, Other (non HMO) | Admitting: Obstetrics and Gynecology

## 2020-09-13 ENCOUNTER — Encounter: Payer: Self-pay | Admitting: Obstetrics and Gynecology

## 2020-09-13 ENCOUNTER — Other Ambulatory Visit (HOSPITAL_COMMUNITY)
Admission: RE | Admit: 2020-09-13 | Discharge: 2020-09-13 | Disposition: A | Discharge status: Home / Self Care | Payer: Self-pay | Source: Ambulatory Visit | Attending: Obstetrics and Gynecology | Admitting: Obstetrics and Gynecology

## 2020-09-13 VITALS — BP 100/80 | Ht 59.0 in | Wt 145.0 lb

## 2020-09-13 DIAGNOSIS — Z113 Encounter for screening for infections with a predominantly sexual mode of transmission: Secondary | ICD-10-CM | POA: Insufficient documentation

## 2020-09-13 DIAGNOSIS — Z01419 Encounter for gynecological examination (general) (routine) without abnormal findings: Secondary | ICD-10-CM | POA: Diagnosis not present

## 2020-09-13 DIAGNOSIS — K589 Irritable bowel syndrome without diarrhea: Secondary | ICD-10-CM | POA: Diagnosis not present

## 2020-09-13 DIAGNOSIS — N921 Excessive and frequent menstruation with irregular cycle: Secondary | ICD-10-CM

## 2020-09-13 MED ORDER — DICYCLOMINE HCL 10 MG PO CAPS: 10.0000 mg | ORAL_CAPSULE | Freq: Four times a day (QID) | ORAL | 0 refills | Status: AC | PRN

## 2020-09-13 NOTE — Progress Notes (Signed)
PCP:  Duanne Limerick, MD   Chief Complaint  Patient presents with   Gynecologic Exam    No concerns     HPI:      Veronica Whitaker is a 35 y.o. [id number] who LMP was Patient's last menstrual period was 08/20/2020 (approximate)., presents today for her annual examination .  Her menses had been monthly till 1/22, but now Q1-2 months, lasting 5-7 days, mod to heavy flow, no BTB, mild dysmen, improved with NSAIDs. Had neg GYN u/s a couple yrs ago for AUB. Did pills, depo and IUD in past.   Was seen 7/21 for severe RLQ pain and sent to ED to rule out appendicitis. Had neg CT. ED thought it was GI and pt was given bentyl which helped somewhat. Pt still with RLQ pain occas and plans to see GI, but sx not as bad now. Could use Rf on bentyl.  Sex activity: single partner, contraception - none/condoms. Declines BC.  Last Pap: 06/28/18 Results: no abnormalities/ neg HPV DNA; s/p cryotx for HPV age 57/16; normal paps since.  Hx of STDs: HPV; would like STD testing.  Has issues with increased vag d/c without itch/odor; hx of BV in past but no sx currently.   There is no FH of breast cancer. There is no FH of ovarian cancer. The patient does do self-breast exams.  Tobacco use: The patient denies current or previous tobacco use. Alcohol use: social drinker No drug use.  Exercise: moderately active  She does get adequate calcium and Vitamin D in her diet.   Past Medical History:  Diagnosis Date   Frequent headaches    Hx of one miscarriage     Past Surgical History:  Procedure Laterality Date   CESAREAN SECTION     CESAREAN SECTION N/A 04/30/2015   Procedure: CESAREAN SECTION;  Surgeon: Hildred Laser, MD;  Location: [facility] ORS;  Service: Obstetrics;  Laterality: N/A;    Family History  Problem Relation Age of Onset   Hypertension Mother    Stroke Sister    Prostate cancer Maternal Grandfather    Diabetes Maternal Grandmother     Social History   Socioeconomic History    Marital status: Married    Spouse name: Not on file   Number of children: Not on file   Years of education: Not on file   Highest education level: Not on file  Occupational History   Occupation: student  Tobacco Use   Smoking status: Former    Types: Cigarettes    Quit date: 01/13/2009    Years since quitting: 11.6   Smokeless tobacco: Never  Vaping Use   Vaping Use: Never used  Substance and Sexual Activity   Alcohol use: Yes    Comment: occassional   Drug use: No   Sexual activity: Yes    Birth control/protection: None, Condom  Other Topics Concern   Not on file  Social History Narrative   Not on file   Social Determinants of Health   Financial Resource Strain: Not on file  Food Insecurity: Not on file  Transportation Needs: Not on file  Physical Activity: Not on file  Stress: Not on file  Social Connections: Not on file  Intimate Partner Violence: Not on file    Outpatient Medications Prior to Visit  Medication Sig Dispense Refill   dicyclomine (BENTYL) 10 MG capsule Take 1 capsule (10 mg total) by mouth 4 (four) times daily as needed for spasms. 60 capsule 0  No facility-administered medications prior to visit.      ROS:  Review of Systems  Constitutional:  Negative for fatigue, fever and unexpected weight change.  Respiratory:  Negative for cough, shortness of breath and wheezing.   Cardiovascular:  Negative for chest pain, palpitations and leg swelling.  Gastrointestinal:  Positive for diarrhea. Negative for blood in stool, constipation, nausea and vomiting.  Endocrine: Negative for cold intolerance, heat intolerance and polyuria.  Genitourinary:  Positive for vaginal discharge. Negative for dyspareunia, dysuria, flank pain, frequency, genital sores, hematuria, menstrual problem, pelvic pain, urgency, vaginal bleeding and vaginal pain.  Musculoskeletal:  Negative for back pain, joint swelling and myalgias.  Skin:  Negative for rash.  Neurological:   Negative for dizziness, syncope, light-headedness, numbness and headaches.  Hematological:  Negative for adenopathy.  Psychiatric/Behavioral:  Negative for agitation, confusion, sleep disturbance and suicidal ideas. The patient is not nervous/anxious.  BREAST: tenderness   Objective: BP 100/80   Ht 4\' 11"  (1.499 m)   Wt 145 lb (65.8 kg)   LMP 08/20/2020 (Approximate)   BMI 29.29 kg/m    Physical Exam Constitutional:      Appearance: She is well-developed.  Genitourinary:     Vulva normal.     Right Labia: No rash, tenderness or lesions.    Left Labia: No tenderness, lesions or rash.    Vaginal bleeding present.     No vaginal discharge, erythema or tenderness.      Right Adnexa: not tender and no mass present.    Left Adnexa: not tender and no mass present.    No cervical friability or polyp.     Uterus is not enlarged or tender.  Breasts:    Right: No mass, nipple discharge, skin change or tenderness.     Left: No mass, nipple discharge, skin change or tenderness.  Neck:     Thyroid: No thyromegaly.  Cardiovascular:     Rate and Rhythm: Normal rate and regular rhythm.     Heart sounds: Normal heart sounds. No murmur heard. Pulmonary:     Effort: Pulmonary effort is normal.     Breath sounds: Normal breath sounds.  Abdominal:     Palpations: Abdomen is soft.     Tenderness: There is no abdominal tenderness. There is no guarding or rebound.  Musculoskeletal:        General: Normal range of motion.     Cervical back: Normal range of motion.  Lymphadenopathy:     Cervical: No cervical adenopathy.  Neurological:     General: No focal deficit present.     Mental Status: She is alert and oriented to person, place, and time.     Cranial Nerves: No cranial nerve deficit.  Skin:    General: Skin is warm and dry.  Psychiatric:        Mood and Affect: Mood normal.        Behavior: Behavior normal.        Thought Content: Thought content normal.        Judgment: Judgment  normal.  Vitals reviewed.    Assessment/Plan: Encounter for annual routine gynecological examination  Screening for STD (sexually transmitted disease) - Plan: Cervicovaginal ancillary only  Menorrhagia with irregular cycle--discussed IUD, BC for cycles and contraception. Cont MVI with Fe. F/u prn.  Spasm of bowel - Plan: dicyclomine (BENTYL) 10 MG capsule; Rx RF. Pt plans to see GI.   Meds ordered this encounter  Medications   dicyclomine (BENTYL) 10 MG capsule  Sig: Take 1 capsule (10 mg total) by mouth 4 (four) times daily as needed for spasms.    Dispense:  60 capsule    Refill:  0    Order Specific Question:   Supervising Provider    Answer:   Nadara Mustard [[id number]]              GYN counsel adequate intake of calcium and vitamin D, diet and exercise     F/U  Return in about 1 year (around 09/13/2021).   Patrena Santalucia B. Autumm Hattery, PA-C 09/13/2020 12:15 PM

## 2020-09-18 LAB — CERVICOVAGINAL ANCILLARY ONLY
Chlamydia: NEGATIVE
Comment: NEGATIVE
Comment: NORMAL
Neisseria Gonorrhea: NEGATIVE

## 2020-11-27 ENCOUNTER — Ambulatory Visit: Payer: Self-pay | Admitting: Family Medicine

## 2021-03-12 ENCOUNTER — Ambulatory Visit: Payer: Self-pay | Admitting: Family Medicine

## 2021-05-30 ENCOUNTER — Other Ambulatory Visit: Payer: Self-pay | Admitting: Family Medicine

## 2021-05-30 DIAGNOSIS — G43809 Other migraine, not intractable, without status migrainosus: Secondary | ICD-10-CM

## 2021-06-25 ENCOUNTER — Ambulatory Visit
Admission: RE | Admit: 2021-06-25 | Discharge: 2021-06-25 | Disposition: A | Discharge status: Home / Self Care | Payer: Managed Care, Other (non HMO) | Source: Ambulatory Visit | Attending: Family Medicine | Admitting: Family Medicine

## 2021-06-25 DIAGNOSIS — G43809 Other migraine, not intractable, without status migrainosus: Secondary | ICD-10-CM

## 2021-06-25 MED ORDER — GADOBENATE DIMEGLUMINE 529 MG/ML IV SOLN
13.0000 mL | Freq: Once | INTRAVENOUS | Status: AC | PRN
  Administered 2021-06-25: 13 mL via INTRAVENOUS

## 2021-12-10 ENCOUNTER — Emergency Department
Admission: EM | Admit: 2021-12-10 | Discharge: 2021-12-10 | Disposition: A | Discharge status: Home / Self Care | Payer: Managed Care, Other (non HMO) | Attending: Emergency Medicine | Admitting: Emergency Medicine

## 2021-12-10 ENCOUNTER — Other Ambulatory Visit: Payer: Self-pay

## 2021-12-10 ENCOUNTER — Encounter: Payer: Self-pay | Admitting: Emergency Medicine

## 2021-12-10 DIAGNOSIS — Y92410 Unspecified street and highway as the place of occurrence of the external cause: Secondary | ICD-10-CM | POA: Diagnosis not present

## 2021-12-10 DIAGNOSIS — S161XXA Strain of muscle, fascia and tendon at neck level, initial encounter: Secondary | ICD-10-CM | POA: Insufficient documentation

## 2021-12-10 DIAGNOSIS — S199XXA Unspecified injury of neck, initial encounter: Secondary | ICD-10-CM | POA: Diagnosis present
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
## 2024-01-22 ENCOUNTER — Other Ambulatory Visit: Payer: Self-pay | Admitting: Family Medicine

## 2024-01-22 DIAGNOSIS — G43809 Other migraine, not intractable, without status migrainosus: Secondary | ICD-10-CM

## 2024-01-26 ENCOUNTER — Ambulatory Visit
Admission: RE | Admit: 2024-01-26 | Discharge: 2024-01-26 | Disposition: A | Discharge status: Home / Self Care | Source: Ambulatory Visit | Attending: Family Medicine | Admitting: Family Medicine

## 2024-01-26 DIAGNOSIS — G43809 Other migraine, not intractable, without status migrainosus: Secondary | ICD-10-CM | POA: Diagnosis present

## 2024-01-26 MED ORDER — GADOBUTROL 1 MMOL/ML IV SOLN
7.5000 mL | Freq: Once | INTRAVENOUS | Status: AC | PRN
  Administered 2024-01-26: 7.5 mL via INTRAVENOUS
